# Patient Record
Sex: Female | Born: 1960 | Race: White | Hispanic: No | Marital: Married | Smoking: Former smoker
Health system: Southern US, Community
[De-identification: ages and names within clinical notes are randomized; demographics above are authoritative.]

## PROBLEM LIST (undated history)

## (undated) DIAGNOSIS — K5792 Diverticulitis of intestine, part unspecified, without perforation or abscess without bleeding: Secondary | ICD-10-CM

## (undated) DIAGNOSIS — M810 Age-related osteoporosis without current pathological fracture: Secondary | ICD-10-CM

## (undated) HISTORY — PX: TUBAL LIGATION: SHX77

## (undated) HISTORY — PX: OTHER SURGICAL HISTORY: SHX169

## (undated) HISTORY — PX: COLONOSCOPY: SHX174

## (undated) HISTORY — DX: Age-related osteoporosis without current pathological fracture: M81.0

## (undated) HISTORY — DX: Diverticulitis of intestine, part unspecified, without perforation or abscess without bleeding: K57.92

---

## 2000-08-04 ENCOUNTER — Ambulatory Visit (HOSPITAL_COMMUNITY): Admission: RE | Admit: 2000-08-04 | Discharge: 2000-08-04 | Payer: Self-pay | Admitting: Obstetrics and Gynecology

## 2000-08-04 ENCOUNTER — Encounter: Payer: Self-pay | Admitting: Obstetrics and Gynecology

## 2001-10-12 ENCOUNTER — Encounter: Payer: Self-pay | Admitting: Obstetrics and Gynecology

## 2001-10-12 ENCOUNTER — Ambulatory Visit (HOSPITAL_COMMUNITY): Admission: RE | Admit: 2001-10-12 | Discharge: 2001-10-12 | Payer: Self-pay | Admitting: Obstetrics and Gynecology

## 2001-11-07 ENCOUNTER — Encounter: Payer: Self-pay | Admitting: Obstetrics and Gynecology

## 2001-11-07 ENCOUNTER — Encounter: Admission: RE | Admit: 2001-11-07 | Discharge: 2001-11-07 | Payer: Self-pay | Admitting: Obstetrics and Gynecology

## 2003-01-18 ENCOUNTER — Ambulatory Visit (HOSPITAL_COMMUNITY): Admission: RE | Admit: 2003-01-18 | Discharge: 2003-01-18 | Payer: Self-pay | Admitting: Obstetrics and Gynecology

## 2004-09-09 ENCOUNTER — Ambulatory Visit (HOSPITAL_COMMUNITY): Admission: RE | Admit: 2004-09-09 | Discharge: 2004-09-09 | Payer: Self-pay | Admitting: Internal Medicine

## 2005-12-03 ENCOUNTER — Ambulatory Visit (HOSPITAL_COMMUNITY): Admission: RE | Admit: 2005-12-03 | Discharge: 2005-12-03 | Payer: Self-pay | Admitting: Obstetrics and Gynecology

## 2005-12-30 ENCOUNTER — Ambulatory Visit (HOSPITAL_COMMUNITY): Admission: RE | Admit: 2005-12-30 | Discharge: 2005-12-30 | Payer: Self-pay | Admitting: Obstetrics and Gynecology

## 2007-03-01 ENCOUNTER — Ambulatory Visit (HOSPITAL_COMMUNITY): Admission: RE | Admit: 2007-03-01 | Discharge: 2007-03-01 | Payer: Self-pay | Admitting: Obstetrics and Gynecology

## 2007-10-17 ENCOUNTER — Other Ambulatory Visit: Admission: RE | Admit: 2007-10-17 | Discharge: 2007-10-17 | Payer: Self-pay | Admitting: Obstetrics & Gynecology

## 2009-10-01 ENCOUNTER — Ambulatory Visit (HOSPITAL_COMMUNITY): Admission: RE | Admit: 2009-10-01 | Discharge: 2009-10-01 | Payer: Self-pay | Admitting: Obstetrics & Gynecology

## 2009-11-14 ENCOUNTER — Other Ambulatory Visit: Admission: RE | Admit: 2009-11-14 | Discharge: 2009-11-14 | Payer: Self-pay | Admitting: Obstetrics & Gynecology

## 2011-04-21 ENCOUNTER — Other Ambulatory Visit: Payer: Self-pay | Admitting: Obstetrics & Gynecology

## 2011-04-21 DIAGNOSIS — Z139 Encounter for screening, unspecified: Secondary | ICD-10-CM

## 2011-04-27 ENCOUNTER — Ambulatory Visit (HOSPITAL_COMMUNITY)
Admission: RE | Admit: 2011-04-27 | Discharge: 2011-04-27 | Disposition: A | Discharge status: Home / Self Care | Payer: 59 | Source: Ambulatory Visit | Attending: Obstetrics & Gynecology | Admitting: Obstetrics & Gynecology

## 2011-04-27 DIAGNOSIS — Z139 Encounter for screening, unspecified: Secondary | ICD-10-CM

## 2011-04-27 DIAGNOSIS — Z1231 Encounter for screening mammogram for malignant neoplasm of breast: Secondary | ICD-10-CM | POA: Insufficient documentation

## 2011-06-11 ENCOUNTER — Other Ambulatory Visit: Payer: Self-pay | Admitting: Obstetrics & Gynecology

## 2011-06-11 ENCOUNTER — Other Ambulatory Visit (HOSPITAL_COMMUNITY)
Admission: RE | Admit: 2011-06-11 | Discharge: 2011-06-11 | Disposition: A | Discharge status: Home / Self Care | Payer: 59 | Source: Ambulatory Visit | Attending: Obstetrics & Gynecology | Admitting: Obstetrics & Gynecology

## 2011-06-11 DIAGNOSIS — Z01419 Encounter for gynecological examination (general) (routine) without abnormal findings: Secondary | ICD-10-CM | POA: Insufficient documentation

## 2013-09-14 ENCOUNTER — Other Ambulatory Visit: Payer: Self-pay | Admitting: Obstetrics & Gynecology

## 2013-09-14 DIAGNOSIS — Z1231 Encounter for screening mammogram for malignant neoplasm of breast: Secondary | ICD-10-CM

## 2013-09-20 ENCOUNTER — Ambulatory Visit (HOSPITAL_COMMUNITY)
Admission: RE | Admit: 2013-09-20 | Discharge: 2013-09-20 | Disposition: A | Discharge status: Home / Self Care | Payer: 59 | Source: Ambulatory Visit | Attending: Obstetrics & Gynecology | Admitting: Obstetrics & Gynecology

## 2013-09-20 DIAGNOSIS — Z1231 Encounter for screening mammogram for malignant neoplasm of breast: Secondary | ICD-10-CM | POA: Diagnosis present

## 2013-09-26 ENCOUNTER — Other Ambulatory Visit: Payer: Self-pay | Admitting: Obstetrics & Gynecology

## 2013-10-10 ENCOUNTER — Other Ambulatory Visit: Payer: Self-pay | Admitting: Obstetrics & Gynecology

## 2013-10-17 ENCOUNTER — Other Ambulatory Visit (HOSPITAL_COMMUNITY)
Admission: RE | Admit: 2013-10-17 | Discharge: 2013-10-17 | Disposition: A | Discharge status: Home / Self Care | Payer: 59 | Source: Ambulatory Visit | Attending: Obstetrics & Gynecology | Admitting: Obstetrics & Gynecology

## 2013-10-17 ENCOUNTER — Ambulatory Visit (INDEPENDENT_AMBULATORY_CARE_PROVIDER_SITE_OTHER): Payer: 59 | Admitting: Obstetrics & Gynecology

## 2013-10-17 ENCOUNTER — Encounter: Payer: Self-pay | Admitting: Obstetrics & Gynecology

## 2013-10-17 VITALS — BP 110/80 | Ht 61.0 in | Wt 124.0 lb

## 2013-10-17 DIAGNOSIS — Z1151 Encounter for screening for human papillomavirus (HPV): Secondary | ICD-10-CM | POA: Insufficient documentation

## 2013-10-17 DIAGNOSIS — Z1212 Encounter for screening for malignant neoplasm of rectum: Secondary | ICD-10-CM

## 2013-10-17 DIAGNOSIS — Z124 Encounter for screening for malignant neoplasm of cervix: Secondary | ICD-10-CM | POA: Insufficient documentation

## 2013-10-17 DIAGNOSIS — Z01419 Encounter for gynecological examination (general) (routine) without abnormal findings: Secondary | ICD-10-CM

## 2013-10-17 NOTE — Progress Notes (Signed)
Patient ID: Brandy Craig, female   DOB: 01-Nov-1960, 53 y.o.   MRN: [id number] Subjective:     Brandy Craig is a 53 y.o. female here for a routine exam.  Patient's last menstrual period was 08/18/2013. No obstetric history on file. Birth Control Method:  Lap BTL Menstrual Calendar(currently): regular  Current complaints: none.   Current acute medical issues:  none   Recent Gynecologic History Patient's last menstrual period was 08/18/2013. Last Pap: 2013,  normal Last mammogram: 09/2013,  normal  History reviewed. No pertinent past medical history.  History reviewed. No pertinent past surgical history.  OB History   Grav Para Term Preterm Abortions TAB SAB Ect Mult Living                  History   Social History  . Marital Status: Married    Spouse Name: N/A    Number of Children: N/A  . Years of Education: N/A   Social History Main Topics  . Smoking status: Former Games developer  . Smokeless tobacco: None  . Alcohol Use: None  . Drug Use: None  . Sexual Activity: None   Other Topics Concern  . None   Social History Narrative  . None    Family History  Problem Relation Age of Onset  . Hypertension Mother      Review of Systems  Review of Systems  Constitutional: Negative for fever, chills, weight loss, malaise/fatigue and diaphoresis.  HENT: Negative for hearing loss, ear pain, nosebleeds, congestion, sore throat, neck pain, tinnitus and ear discharge.   Eyes: Negative for blurred vision, double vision, photophobia, pain, discharge and redness.  Respiratory: Negative for cough, hemoptysis, sputum production, shortness of breath, wheezing and stridor.   Cardiovascular: Negative for chest pain, palpitations, orthopnea, claudication, leg swelling and PND.  Gastrointestinal: negative for abdominal pain. Negative for heartburn, nausea, vomiting, diarrhea, constipation, blood in stool and melena.  Genitourinary: Negative for dysuria, urgency, frequency, hematuria and  flank pain.  Musculoskeletal: Negative for myalgias, back pain, joint pain and falls.  Skin: Negative for itching and rash.  Neurological: Negative for dizziness, tingling, tremors, sensory change, speech change, focal weakness, seizures, loss of consciousness, weakness and headaches.  Endo/Heme/Allergies: Negative for environmental allergies and polydipsia. Does not bruise/bleed easily.  Psychiatric/Behavioral: Negative for depression, suicidal ideas, hallucinations, memory loss and substance abuse. The patient is not nervous/anxious and does not have insomnia.        Objective:    Physical Exam  Vitals reviewed. Constitutional: She is oriented to person, place, and time. She appears well-developed and well-nourished.  HENT:  Head: Normocephalic and atraumatic.        Right Ear: External ear normal.  Left Ear: External ear normal.  Nose: Nose normal.  Mouth/Throat: Oropharynx is clear and moist.  Eyes: Conjunctivae and EOM are normal. Pupils are equal, round, and reactive to light. Right eye exhibits no discharge. Left eye exhibits no discharge. No scleral icterus.  Neck: Normal range of motion. Neck supple. No tracheal deviation present. No thyromegaly present.  Cardiovascular: Normal rate, regular rhythm, normal heart sounds and intact distal pulses.  Exam reveals no gallop and no friction rub.   No murmur heard. Respiratory: Effort normal and breath sounds normal. No respiratory distress. She has no wheezes. She has no rales. She exhibits no tenderness.  GI: Soft. Bowel sounds are normal. She exhibits no distension and no mass. There is no tenderness. There is no rebound and no guarding.  Genitourinary:  Breasts no masses skin changes or nipple changes bilaterally      Vulva is normal without lesions Vagina is pink moist without discharge Cervix normal in appearance and pap is done Uterus is normal size shape and contour Adnexa is negative with normal sized ovaries  Rectal     hemoccult negative, normal tone, no masses  Musculoskeletal: Normal range of motion. She exhibits no edema and no tenderness.  Neurological: She is alert and oriented to person, place, and time. She has normal reflexes. She displays normal reflexes. No cranial nerve deficit. She exhibits normal muscle tone. Coordination normal.  Skin: Skin is warm and dry. No rash noted. No erythema. No pallor.  Psychiatric: She has a normal mood and affect. Her behavior is normal. Judgment and thought content normal.       Assessment:    Healthy female exam.    Plan:    Follow up in: 1 year.

## 2013-10-19 LAB — CYTOLOGY - PAP

## 2014-06-12 ENCOUNTER — Ambulatory Visit (INDEPENDENT_AMBULATORY_CARE_PROVIDER_SITE_OTHER): Payer: 59 | Admitting: Obstetrics & Gynecology

## 2014-06-12 ENCOUNTER — Encounter: Payer: Self-pay | Admitting: Obstetrics & Gynecology

## 2014-06-12 DIAGNOSIS — M7711 Lateral epicondylitis, right elbow: Secondary | ICD-10-CM

## 2014-06-12 DIAGNOSIS — R300 Dysuria: Secondary | ICD-10-CM | POA: Diagnosis not present

## 2014-06-12 DIAGNOSIS — N390 Urinary tract infection, site not specified: Secondary | ICD-10-CM

## 2014-06-12 LAB — POCT URINALYSIS DIPSTICK
Blood, UA: NEGATIVE
GLUCOSE UA: NEGATIVE
KETONES UA: NEGATIVE
Leukocytes, UA: NEGATIVE
Nitrite, UA: NEGATIVE
Protein, UA: NEGATIVE

## 2014-06-12 MED ORDER — PIROXICAM 20 MG PO CAPS: 20.0000 mg | ORAL_CAPSULE | Freq: Every day | ORAL | Status: DC

## 2014-06-12 MED ORDER — SULFAMETHOXAZOLE-TRIMETHOPRIM 800-160 MG PO TABS: 1.0000 | ORAL_TABLET | Freq: Two times a day (BID) | ORAL | Status: DC

## 2014-06-12 NOTE — Progress Notes (Signed)
Patient ID: Brandy Craig, female   DOB: 1960-06-04, 54 y.o.   MRN: [id number] Chief Complaint  Patient presents with  . gyn visit    c/c burning/ frequency with urination.    Blood pressure 120/80, pulse 60, temperature 97.8 F (36.6 C), weight 125 lb (56.7 kg).  Has had infrequent UTIs in the past Having symptoms for several days ua negative on pyridium  Bactrim DS x 7 days  Feldene daily for 1 month for back and generalized body aches and pains  FU 1 month

## 2014-06-14 LAB — URINE CULTURE: Organism ID, Bacteria: NO GROWTH

## 2015-06-20 ENCOUNTER — Ambulatory Visit (INDEPENDENT_AMBULATORY_CARE_PROVIDER_SITE_OTHER): Payer: Commercial Managed Care - HMO | Admitting: Obstetrics & Gynecology

## 2015-06-20 ENCOUNTER — Encounter: Payer: Self-pay | Admitting: Obstetrics & Gynecology

## 2015-06-20 ENCOUNTER — Other Ambulatory Visit (HOSPITAL_COMMUNITY)
Admission: RE | Admit: 2015-06-20 | Discharge: 2015-06-20 | Disposition: A | Discharge status: Home / Self Care | Payer: Self-pay | Source: Ambulatory Visit | Attending: Obstetrics & Gynecology | Admitting: Obstetrics & Gynecology

## 2015-06-20 VITALS — BP 110/70 | HR 72 | Ht 59.0 in | Wt 128.0 lb

## 2015-06-20 DIAGNOSIS — Z1211 Encounter for screening for malignant neoplasm of colon: Secondary | ICD-10-CM

## 2015-06-20 DIAGNOSIS — R7989 Other specified abnormal findings of blood chemistry: Secondary | ICD-10-CM

## 2015-06-20 DIAGNOSIS — E7800 Pure hypercholesterolemia, unspecified: Secondary | ICD-10-CM

## 2015-06-20 DIAGNOSIS — Z01419 Encounter for gynecological examination (general) (routine) without abnormal findings: Secondary | ICD-10-CM | POA: Diagnosis not present

## 2015-06-20 DIAGNOSIS — Z1212 Encounter for screening for malignant neoplasm of rectum: Secondary | ICD-10-CM

## 2015-06-20 DIAGNOSIS — R739 Hyperglycemia, unspecified: Secondary | ICD-10-CM

## 2015-06-20 DIAGNOSIS — E038 Other specified hypothyroidism: Secondary | ICD-10-CM

## 2015-06-20 NOTE — Progress Notes (Signed)
Patient ID: Brandy Craig, female   DOB: January 18, 1961, 55 y.o.   MRN: [id number] Subjective:     Brandy Craig is a 55 y.o. female here for a routine exam.  No LMP recorded. No obstetric history on file. Birth Control Method:  Postmenopausal state Menstrual Calendar(currently): Amenorrheic  Current complaints: Lethargy  Current acute medical issues:  None   Recent Gynecologic History No LMP recorded. Last Pap: 2015,  normal Last mammogram: 2015,  normal  History reviewed. No pertinent past medical history.  History reviewed. No pertinent past surgical history.  OB History    No data available      Social History   Social History  . Marital Status: Married    Spouse Name: N/A  . Number of Children: N/A  . Years of Education: N/A   Social History Main Topics  . Smoking status: Former Games developer  . Smokeless tobacco: None  . Alcohol Use: None  . Drug Use: None  . Sexual Activity: Not Asked   Other Topics Concern  . None   Social History Narrative    Family History  Problem Relation Age of Onset  . Hypertension Mother      Current outpatient prescriptions:  .  Calcium Carbonate-Vitamin D (CALCIUM-VITAMIN D) 500-200 MG-UNIT tablet, Take 1 tablet by mouth daily., Disp: , Rfl:   Review of Systems  Review of Systems  Constitutional: Negative for fever, chills, weight loss, malaise/fatigue and diaphoresis.  HENT: Negative for hearing loss, ear pain, nosebleeds, congestion, sore throat, neck pain, tinnitus and ear discharge.   Eyes: Negative for blurred vision, double vision, photophobia, pain, discharge and redness.  Respiratory: Negative for cough, hemoptysis, sputum production, shortness of breath, wheezing and stridor.   Cardiovascular: Negative for chest pain, palpitations, orthopnea, claudication, leg swelling and PND.  Gastrointestinal: negative for abdominal pain. Negative for heartburn, nausea, vomiting, diarrhea, constipation, blood in stool and melena.   Genitourinary: Negative for dysuria, urgency, frequency, hematuria and flank pain.  Musculoskeletal: Negative for myalgias, back pain, joint pain and falls.  Skin: Negative for itching and rash.  Neurological: Negative for dizziness, tingling, tremors, sensory change, speech change, focal weakness, seizures, loss of consciousness, weakness and headaches.  Endo/Heme/Allergies: Negative for environmental allergies and polydipsia. Does not bruise/bleed easily.  Psychiatric/Behavioral: Negative for depression, suicidal ideas, hallucinations, memory loss and substance abuse. The patient is not nervous/anxious and does not have insomnia.        Objective:  Blood pressure 110/70, pulse 72, height  (1.499 m), weight 128 lb (58.06 kg).   Physical Exam  Vitals reviewed. Constitutional: She is oriented to person, place, and time. She appears well-developed and well-nourished.  HENT:  Head: Normocephalic and atraumatic.        Right Ear: External ear normal.  Left Ear: External ear normal.  Nose: Nose normal.  Mouth/Throat: Oropharynx is clear and moist.  Eyes: Conjunctivae and EOM are normal. Pupils are equal, round, and reactive to light. Right eye exhibits no discharge. Left eye exhibits no discharge. No scleral icterus.  Neck: Normal range of motion. Neck supple. No tracheal deviation present. No thyromegaly present.  Cardiovascular: Normal rate, regular rhythm, normal heart sounds and intact distal pulses.  Exam reveals no gallop and no friction rub.   No murmur heard. Respiratory: Effort normal and breath sounds normal. No respiratory distress. She has no wheezes. She has no rales. She exhibits no tenderness.  GI: Soft. Bowel sounds are normal. She exhibits no distension and no mass. There  is no tenderness. There is no rebound and no guarding.  Genitourinary:  Breasts no masses skin changes or nipple changes bilaterally      Vulva is normal without lesions Vagina is pink moist without  discharge Cervix normal in appearance and pap is done Uterus is normal size shape and contour Adnexa is negative with normal sized ovaries  {Rectal    hemoccult negative, normal tone, no masses  Musculoskeletal: Normal range of motion. She exhibits no edema and no tenderness.  Neurological: She is alert and oriented to person, place, and time. She has normal reflexes. She displays normal reflexes. No cranial nerve deficit. She exhibits normal muscle tone. Coordination normal.  Skin: Skin is warm and dry. No rash noted. No erythema. No pallor.  Psychiatric: She has a normal mood and affect. Her behavior is normal. Judgment and thought content normal.       Medications Ordered at today's visit: Meds ordered this encounter  Medications  . Calcium Carbonate-Vitamin D (CALCIUM-VITAMIN D) 500-200 MG-UNIT tablet    Sig: Take 1 tablet by mouth daily.    Other orders placed at today's visit: Orders Placed This Encounter  Procedures  . Vitamin D 1,25 dihydroxy  . Lipid Profile  . TSH  . HgB A1c      Assessment:    Healthy female exam.    Plan:    Mammogram ordered. Follow up in: 1 year.

## 2015-06-24 LAB — CYTOLOGY - PAP

## 2015-09-30 ENCOUNTER — Other Ambulatory Visit: Payer: Commercial Managed Care - HMO

## 2015-09-30 ENCOUNTER — Telehealth: Payer: Self-pay | Admitting: *Deleted

## 2015-09-30 ENCOUNTER — Other Ambulatory Visit: Payer: Self-pay | Admitting: Obstetrics & Gynecology

## 2015-09-30 DIAGNOSIS — R300 Dysuria: Secondary | ICD-10-CM

## 2015-09-30 MED ORDER — SULFAMETHOXAZOLE-TRIMETHOPRIM 800-160 MG PO TABS: 1.0000 | ORAL_TABLET | Freq: Two times a day (BID) | ORAL | 0 refills | Status: DC

## 2015-09-30 NOTE — Addendum Note (Signed)
Addended by: Criss AlvinePULLIAM, Taysia Rivere G on: 09/30/2015 03:54 PM   Modules accepted: Orders

## 2015-09-30 NOTE — Telephone Encounter (Signed)
Pt requesting abx for UTI, c/o burning with urination.

## 2015-09-30 NOTE — Telephone Encounter (Signed)
Pt informed Bactrim e-scribed to [facility], [place]. Pt verbalized understanding.

## 2015-10-02 LAB — URINE CULTURE

## 2016-10-22 ENCOUNTER — Other Ambulatory Visit: Payer: Self-pay | Admitting: Obstetrics & Gynecology

## 2016-10-22 DIAGNOSIS — Z1231 Encounter for screening mammogram for malignant neoplasm of breast: Secondary | ICD-10-CM

## 2016-10-26 ENCOUNTER — Encounter (HOSPITAL_COMMUNITY): Payer: Self-pay | Admitting: Radiology

## 2016-10-26 ENCOUNTER — Ambulatory Visit (HOSPITAL_COMMUNITY)
Admission: RE | Admit: 2016-10-26 | Discharge: 2016-10-26 | Disposition: A | Discharge status: Home / Self Care | Payer: 59 | Source: Ambulatory Visit | Attending: Obstetrics & Gynecology | Admitting: Obstetrics & Gynecology

## 2016-10-26 DIAGNOSIS — Z1231 Encounter for screening mammogram for malignant neoplasm of breast: Secondary | ICD-10-CM | POA: Diagnosis not present

## 2016-11-20 ENCOUNTER — Other Ambulatory Visit: Payer: Commercial Managed Care - HMO | Admitting: Obstetrics & Gynecology

## 2016-12-21 ENCOUNTER — Other Ambulatory Visit: Payer: Commercial Managed Care - HMO | Admitting: Obstetrics & Gynecology

## 2016-12-31 ENCOUNTER — Ambulatory Visit (INDEPENDENT_AMBULATORY_CARE_PROVIDER_SITE_OTHER): Payer: 59 | Admitting: Obstetrics & Gynecology

## 2016-12-31 ENCOUNTER — Other Ambulatory Visit: Payer: Self-pay

## 2016-12-31 ENCOUNTER — Encounter: Payer: Self-pay | Admitting: Obstetrics & Gynecology

## 2016-12-31 ENCOUNTER — Other Ambulatory Visit (HOSPITAL_COMMUNITY)
Admission: RE | Admit: 2016-12-31 | Discharge: 2016-12-31 | Disposition: A | Discharge status: Home / Self Care | Payer: 59 | Source: Ambulatory Visit | Attending: Obstetrics & Gynecology | Admitting: Obstetrics & Gynecology

## 2016-12-31 VITALS — BP 100/52 | HR 77 | Ht 59.0 in | Wt 128.0 lb

## 2016-12-31 DIAGNOSIS — Z01419 Encounter for gynecological examination (general) (routine) without abnormal findings: Secondary | ICD-10-CM

## 2016-12-31 DIAGNOSIS — N841 Polyp of cervix uteri: Secondary | ICD-10-CM | POA: Diagnosis not present

## 2016-12-31 DIAGNOSIS — Z1211 Encounter for screening for malignant neoplasm of colon: Secondary | ICD-10-CM

## 2016-12-31 DIAGNOSIS — Z01411 Encounter for gynecological examination (general) (routine) with abnormal findings: Secondary | ICD-10-CM

## 2016-12-31 LAB — HEMOCCULT GUIAC POC 1CARD (OFFICE): Fecal Occult Blood, POC: NEGATIVE

## 2016-12-31 NOTE — Progress Notes (Signed)
Subjective:     Brandy Craig is a 56 y.o. female here for a routine exam.  Patient's last menstrual period was 08/18/2013. [id number] Birth Control Method:  Tubal ligation Menstrual Calendar(currently): Amenorrheic  Current complaints: None.   Current acute medical issues:  None   Recent Gynecologic History Patient's last menstrual period was 08/18/2013. Last Pap: 2017,  normal Last mammogram: 2018,  normal  History reviewed. No pertinent past medical history.  Past Surgical History:  Procedure Laterality Date  . btl    . TUBAL LIGATION      OB History    Gravida Para Term Preterm AB Living   4 2 2   2 2    SAB TAB Ectopic Multiple Live Births   2              Social History   Socioeconomic History  . Marital status: Married    Spouse name: None  . Number of children: None  . Years of education: None  . Highest education level: None  Social Needs  . Financial resource strain: None  . Food insecurity - worry: None  . Food insecurity - inability: None  . Transportation needs - medical: None  . Transportation needs - non-medical: None  Occupational History  . None  Tobacco Use  . Smoking status: Former Games developermoker  . Smokeless tobacco: Never Used  Substance and Sexual Activity  . Alcohol use: No    Frequency: Never  . Drug use: No  . Sexual activity: Yes    Birth control/protection: Surgical  Other Topics Concern  . None  Social History Narrative  . None    Family History  Problem Relation Age of Onset  . Hypertension Mother   . Hypertension Paternal Grandfather      Current Outpatient Medications:  .  Calcium Carbonate-Vitamin D (CALCIUM-VITAMIN D) 500-200 MG-UNIT tablet, Take 1 tablet by mouth daily., Disp: , Rfl:   Review of Systems  Review of Systems  Constitutional: Negative for fever, chills, weight loss, malaise/fatigue and diaphoresis.  HENT: Negative for hearing loss, ear pain, nosebleeds, congestion, sore throat, neck pain, tinnitus and ear  discharge.   Eyes: Negative for blurred vision, double vision, photophobia, pain, discharge and redness.  Respiratory: Negative for cough, hemoptysis, sputum production, shortness of breath, wheezing and stridor.   Cardiovascular: Negative for chest pain, palpitations, orthopnea, claudication, leg swelling and PND.  Gastrointestinal: negative for abdominal pain. Negative for heartburn, nausea, vomiting, diarrhea, constipation, blood in stool and melena.  Genitourinary: Negative for dysuria, urgency, frequency, hematuria and flank pain.  Musculoskeletal: Negative for myalgias, back pain, joint pain and falls.  Skin: Negative for itching and rash.  Neurological: Negative for dizziness, tingling, tremors, sensory change, speech change, focal weakness, seizures, loss of consciousness, weakness and headaches.  Endo/Heme/Allergies: Negative for environmental allergies and polydipsia. Does not bruise/bleed easily.  Psychiatric/Behavioral: Negative for depression, suicidal ideas, hallucinations, memory loss and substance abuse. The patient is not nervous/anxious and does not have insomnia.        Objective:  Blood pressure (!) 100/52, pulse 77, height 4\' 11"  (1.499 m), weight 128 lb (58.1 kg), last menstrual period 08/18/2013.   Physical Exam  Vitals reviewed. Constitutional: She is oriented to person, place, and time. She appears well-developed and well-nourished.  HENT:  Head: Normocephalic and atraumatic.        Right Ear: External ear normal.  Left Ear: External ear normal.  Nose: Nose normal.  Mouth/Throat: Oropharynx is clear and moist.  Eyes:  Conjunctivae and EOM are normal. Pupils are equal, round, and reactive to light. Right eye exhibits no discharge. Left eye exhibits no discharge. No scleral icterus.  Neck: Normal range of motion. Neck supple. No tracheal deviation present. No thyromegaly present.  Cardiovascular: Normal rate, regular rhythm, normal heart sounds and intact distal  pulses.  Exam reveals no gallop and no friction rub.   No murmur heard. Respiratory: Effort normal and breath sounds normal. No respiratory distress. She has no wheezes. She has no rales. She exhibits no tenderness.  GI: Soft. Bowel sounds are normal. She exhibits no distension and no mass. There is no tenderness. There is no rebound and no guarding.  Genitourinary:  Breasts no masses skin changes or nipple changes bilaterally      Vulva is normal without lesions Vagina is pink moist without discharge Cervix normal in appearance and pap is done, small under cervical polyp is seen is removed without difficulty with cervical biopsy forceps  It is sent to pathology for evaluation , I have reassured the patient that it is very likely benign  Uterus is normal size shape and contour Adnexa is negative with normal sized ovaries  {Rectal    hemoccult negative, normal tone, no masses  Musculoskeletal: Normal range of motion. She exhibits no edema and no tenderness.  Neurological: She is alert and oriented to person, place, and time. She has normal reflexes. She displays normal reflexes. No cranial nerve deficit. She exhibits normal muscle tone. Coordination normal.  Skin: Skin is warm and dry. No rash noted. No erythema. No pallor.  Psychiatric: She has a normal mood and affect. Her behavior is normal. Judgment and thought content normal.       Medications Ordered at today's visit: No orders of the defined types were placed in this encounter.   Other orders placed at today's visit: Orders Placed This Encounter  Procedures  . POCT Occult Blood Stool  . PR BIOPSY CERVIX, 1 OR MORE, OR EXCISION OF LESION      Assessment:    Healthy female exam.   endocervical polyp Plan:    Mammogram ordered. Follow up in: 1 year.   Will call with results of endocervical polyp  Return in about 1 year (around 12/31/2017) for yearly, with Dr Despina HiddenEure.

## 2017-01-01 ENCOUNTER — Other Ambulatory Visit: Payer: Self-pay | Admitting: Obstetrics & Gynecology

## 2017-01-05 LAB — CYTOLOGY - PAP
Diagnosis: UNDETERMINED — AB
HPV (WINDOPATH): NOT DETECTED

## 2019-04-25 ENCOUNTER — Other Ambulatory Visit (HOSPITAL_COMMUNITY): Payer: Self-pay | Admitting: Obstetrics & Gynecology

## 2019-04-25 DIAGNOSIS — Z1231 Encounter for screening mammogram for malignant neoplasm of breast: Secondary | ICD-10-CM

## 2019-05-01 ENCOUNTER — Ambulatory Visit (HOSPITAL_COMMUNITY)
Admission: RE | Admit: 2019-05-01 | Discharge: 2019-05-01 | Disposition: A | Discharge status: Home / Self Care | Payer: 59 | Source: Ambulatory Visit | Attending: Obstetrics & Gynecology | Admitting: Obstetrics & Gynecology

## 2019-05-01 ENCOUNTER — Other Ambulatory Visit: Payer: Self-pay

## 2019-05-01 DIAGNOSIS — Z1231 Encounter for screening mammogram for malignant neoplasm of breast: Secondary | ICD-10-CM

## 2019-05-11 ENCOUNTER — Telehealth: Payer: Self-pay | Admitting: Obstetrics & Gynecology

## 2019-05-11 NOTE — Telephone Encounter (Signed)
Tried to reach the patient to remind her of her appointment/restrictions, no answer or answering machine. °

## 2019-05-15 ENCOUNTER — Ambulatory Visit (INDEPENDENT_AMBULATORY_CARE_PROVIDER_SITE_OTHER): Payer: 59 | Admitting: Obstetrics & Gynecology

## 2019-05-15 ENCOUNTER — Other Ambulatory Visit: Payer: Self-pay

## 2019-05-15 ENCOUNTER — Other Ambulatory Visit (HOSPITAL_COMMUNITY)
Admission: RE | Admit: 2019-05-15 | Discharge: 2019-05-15 | Disposition: A | Discharge status: Home / Self Care | Payer: 59 | Source: Ambulatory Visit | Attending: Obstetrics & Gynecology | Admitting: Obstetrics & Gynecology

## 2019-05-15 ENCOUNTER — Encounter: Payer: Self-pay | Admitting: Obstetrics & Gynecology

## 2019-05-15 VITALS — BP 117/76 | HR 66 | Ht 59.0 in | Wt 126.0 lb

## 2019-05-15 DIAGNOSIS — Z01419 Encounter for gynecological examination (general) (routine) without abnormal findings: Secondary | ICD-10-CM | POA: Insufficient documentation

## 2019-05-15 DIAGNOSIS — Z1211 Encounter for screening for malignant neoplasm of colon: Secondary | ICD-10-CM | POA: Diagnosis not present

## 2019-05-15 DIAGNOSIS — Z1212 Encounter for screening for malignant neoplasm of rectum: Secondary | ICD-10-CM | POA: Diagnosis not present

## 2019-05-15 NOTE — Progress Notes (Signed)
Subjective:     Brandy Craig is a 59 y.o. female here for a routine exam.  Patient's last menstrual period was 08/18/2013. [id number] Birth Control Method:  BTL Menstrual Calendar(currently): amenorrheic  Current complaints: none.   Current acute medical issues:  one   Recent Gynecologic History Patient's last menstrual period was 08/18/2013. Last Pap: 2018,  normal Last mammogram: 3/21,  normal  History reviewed. No pertinent past medical history.  Past Surgical History:  Procedure Laterality Date  . btl    . TUBAL LIGATION      OB History    Gravida  4   Para  2   Term  2   Preterm      AB  2   Living  2     SAB  2   TAB      Ectopic      Multiple      Live Births              Social History   Socioeconomic History  . Marital status: Married    Spouse name: Not on file  . Number of children: Not on file  . Years of education: Not on file  . Highest education level: Not on file  Occupational History  . Not on file  Tobacco Use  . Smoking status: Former Games developer  . Smokeless tobacco: Never Used  Substance and Sexual Activity  . Alcohol use: No  . Drug use: No  . Sexual activity: Yes    Birth control/protection: Surgical  Other Topics Concern  . Not on file  Social History Narrative  . Not on file   Social Determinants of Health   Financial Resource Strain:   . Difficulty of Paying Living Expenses:   Food Insecurity:   . Worried About Programme researcher, broadcasting/film/video in the Last Year:   . Barista in the Last Year:   Transportation Needs:   . Freight forwarder (Medical):   Marland Kitchen Lack of Transportation (Non-Medical):   Physical Activity:   . Days of Exercise per Week:   . Minutes of Exercise per Session:   Stress:   . Feeling of Stress :   Social Connections:   . Frequency of Communication with Friends and Family:   . Frequency of Social Gatherings with Friends and Family:   . Attends Religious Services:   . Active Member of Clubs or  Organizations:   . Attends Banker Meetings:   Marland Kitchen Marital Status:     Family History  Problem Relation Age of Onset  . Hypertension Mother   . Hypertension Paternal Grandfather      Current Outpatient Medications:  .  Calcium Carbonate-Vitamin D (CALCIUM-VITAMIN D) 500-200 MG-UNIT tablet, Take 1 tablet by mouth daily., Disp: , Rfl:   Review of Systems  Review of Systems  Constitutional: Negative for fever, chills, weight loss, malaise/fatigue and diaphoresis.  HENT: Negative for hearing loss, ear pain, nosebleeds, congestion, sore throat, neck pain, tinnitus and ear discharge.   Eyes: Negative for blurred vision, double vision, photophobia, pain, discharge and redness.  Respiratory: Negative for cough, hemoptysis, sputum production, shortness of breath, wheezing and stridor.   Cardiovascular: Negative for chest pain, palpitations, orthopnea, claudication, leg swelling and PND.  Gastrointestinal: negative for abdominal pain. Negative for heartburn, nausea, vomiting, diarrhea, constipation, blood in stool and melena.  Genitourinary: Negative for dysuria, urgency, frequency, hematuria and flank pain.  Musculoskeletal: Negative for myalgias, back pain, joint pain  and falls.  Skin: Negative for itching and rash.  Neurological: Negative for dizziness, tingling, tremors, sensory change, speech change, focal weakness, seizures, loss of consciousness, weakness and headaches.  Endo/Heme/Allergies: Negative for environmental allergies and polydipsia. Does not bruise/bleed easily.  Psychiatric/Behavioral: Negative for depression, suicidal ideas, hallucinations, memory loss and substance abuse. The patient is not nervous/anxious and does not have insomnia.        Objective:  Blood pressure 117/76, pulse 66, height 4\' 11"  (1.499 m), weight 126 lb (57.2 kg), last menstrual period 08/18/2013.   Physical Exam  Vitals reviewed. Constitutional: She is oriented to person, place, and  time. She appears well-developed and well-nourished.  HENT:  Head: Normocephalic and atraumatic.        Right Ear: External ear normal.  Left Ear: External ear normal.  Nose: Nose normal.  Mouth/Throat: Oropharynx is clear and moist.  Eyes: Conjunctivae and EOM are normal. Pupils are equal, round, and reactive to light. Right eye exhibits no discharge. Left eye exhibits no discharge. No scleral icterus.  Neck: Normal range of motion. Neck supple. No tracheal deviation present. No thyromegaly present.  Cardiovascular: Normal rate, regular rhythm, normal heart sounds and intact distal pulses.  Exam reveals no gallop and no friction rub.   No murmur heard. Respiratory: Effort normal and breath sounds normal. No respiratory distress. She has no wheezes. She has no rales. She exhibits no tenderness.  GI: Soft. Bowel sounds are normal. She exhibits no distension and no mass. There is no tenderness. There is no rebound and no guarding.  Genitourinary:  Breasts no masses skin changes or nipple changes bilaterally      Vulva is normal without lesions Vagina is pink moist without discharge Cervix normal in appearance and pap is done Uterus is normal size shape and contour Adnexa is negative with normal sized ovaries  {Rectal    hemoccult negative, normal tone, no masses  Musculoskeletal: Normal range of motion. She exhibits no edema and no tenderness.  Neurological: She is alert and oriented to person, place, and time. She has normal reflexes. She displays normal reflexes. No cranial nerve deficit. She exhibits normal muscle tone. Coordination normal.  Skin: Skin is warm and dry. No rash noted. No erythema. No pallor.  Psychiatric: She has a normal mood and affect. Her behavior is normal. Judgment and thought content normal.       Medications Ordered at today's visit: No orders of the defined types were placed in this encounter.   Other orders placed at today's visit: No orders of the defined  types were placed in this encounter.     Assessment:    Normal Gyn exam.    Plan:    Contraception: tubal ligation. Mammogram ordered. Follow up in: 3 years.     Return in about 3 years (around 05/15/2022) for with Dr Elonda Husky.

## 2019-05-17 LAB — CYTOLOGY - PAP
Comment: NEGATIVE
Diagnosis: NEGATIVE
Diagnosis: REACTIVE
High risk HPV: NEGATIVE

## 2020-06-03 ENCOUNTER — Other Ambulatory Visit (HOSPITAL_COMMUNITY): Payer: Self-pay | Admitting: Obstetrics & Gynecology

## 2020-06-03 DIAGNOSIS — Z1231 Encounter for screening mammogram for malignant neoplasm of breast: Secondary | ICD-10-CM

## 2020-06-06 ENCOUNTER — Other Ambulatory Visit: Payer: Self-pay

## 2020-06-06 ENCOUNTER — Ambulatory Visit (HOSPITAL_COMMUNITY)
Admission: RE | Admit: 2020-06-06 | Discharge: 2020-06-06 | Disposition: A | Discharge status: Home / Self Care | Payer: 59 | Source: Ambulatory Visit | Attending: Obstetrics & Gynecology | Admitting: Obstetrics & Gynecology

## 2020-06-06 DIAGNOSIS — Z1231 Encounter for screening mammogram for malignant neoplasm of breast: Secondary | ICD-10-CM | POA: Diagnosis present

## 2021-01-02 ENCOUNTER — Telehealth: Payer: Self-pay | Admitting: Obstetrics & Gynecology

## 2021-01-02 MED ORDER — FLUCONAZOLE 150 MG PO TABS: ORAL_TABLET | ORAL | 4 refills | Status: DC

## 2021-01-02 NOTE — Telephone Encounter (Signed)
Patient states she has a yeast infection and wants to see if you can call her in something for it.

## 2021-01-02 NOTE — Telephone Encounter (Signed)
Returned pt's call. Two identifiers used. Pt states she is having itching, swelling, odor, irritation, and white, clumpy discharge. She has taken Diflucan in the past and wanted to have that again, along with some refills, if possible.

## 2021-01-02 NOTE — Telephone Encounter (Signed)
Called pt to relay prescription info. Two identifiers used. Pt confirmed understanding.

## 2021-07-28 ENCOUNTER — Other Ambulatory Visit (HOSPITAL_COMMUNITY): Payer: Self-pay | Admitting: Obstetrics & Gynecology

## 2021-07-28 DIAGNOSIS — Z1231 Encounter for screening mammogram for malignant neoplasm of breast: Secondary | ICD-10-CM

## 2021-07-30 ENCOUNTER — Ambulatory Visit (HOSPITAL_COMMUNITY)
Admission: RE | Admit: 2021-07-30 | Discharge: 2021-07-30 | Disposition: A | Discharge status: Home / Self Care | Payer: 59 | Source: Ambulatory Visit | Attending: Obstetrics & Gynecology | Admitting: Obstetrics & Gynecology

## 2021-07-30 DIAGNOSIS — Z1231 Encounter for screening mammogram for malignant neoplasm of breast: Secondary | ICD-10-CM

## 2022-08-31 ENCOUNTER — Encounter (INDEPENDENT_AMBULATORY_CARE_PROVIDER_SITE_OTHER): Payer: Self-pay | Admitting: *Deleted

## 2022-09-03 ENCOUNTER — Ambulatory Visit: Payer: 59 | Admitting: Obstetrics & Gynecology

## 2022-09-24 ENCOUNTER — Ambulatory Visit (INDEPENDENT_AMBULATORY_CARE_PROVIDER_SITE_OTHER): Payer: 59 | Admitting: Gastroenterology

## 2022-09-28 ENCOUNTER — Ambulatory Visit (INDEPENDENT_AMBULATORY_CARE_PROVIDER_SITE_OTHER): Payer: 59 | Admitting: Gastroenterology

## 2022-09-28 ENCOUNTER — Encounter (INDEPENDENT_AMBULATORY_CARE_PROVIDER_SITE_OTHER): Payer: Self-pay | Admitting: Gastroenterology

## 2022-09-28 VITALS — BP 117/67 | HR 59 | Temp 98.2°F | Ht 60.0 in | Wt 128.0 lb

## 2022-09-28 DIAGNOSIS — R1319 Other dysphagia: Secondary | ICD-10-CM | POA: Diagnosis not present

## 2022-09-28 DIAGNOSIS — K5732 Diverticulitis of large intestine without perforation or abscess without bleeding: Secondary | ICD-10-CM

## 2022-09-28 DIAGNOSIS — K5904 Chronic idiopathic constipation: Secondary | ICD-10-CM | POA: Diagnosis not present

## 2022-09-28 MED ORDER — POLYETHYLENE GLYCOL 3350 17 G PO PACK: 17.0000 g | PACK | Freq: Two times a day (BID) | ORAL | 0 refills | Status: AC

## 2022-09-28 MED ORDER — PSYLLIUM 58.6 % PO PACK: 1.0000 | PACK | Freq: Two times a day (BID) | ORAL | 2 refills | Status: AC

## 2022-09-28 NOTE — Patient Instructions (Signed)
It was very nice to meet you today, as dicussed with will plan for the following :  1) Ct Abdomen and pelvis  2) Colonoscopy and endoscopy  3) Ensure adequate fluid intake: Aim for 8 glasses of water daily. Follow a high fiber diet: Include foods such as dates, prunes, pears, and kiwi. Take Miralax twice a day for the first week, then reduce to once daily thereafter. Use Metamucil twice a day.

## 2022-09-28 NOTE — Progress Notes (Signed)
Vista Lawman , M.D. Gastroenterology & Hepatology [facility] Gastroenterology [address] [place], [place] [address] Primary Care Physician: Wendall Papa [address] [place] [address]  Chief Complaint:   Dysphagia and colonoscopy after episode of diverticulitis  History of Present Illness: Brandy Craig is a 62 y.o. female with ?history of recurrent diverticulitis who presents for evaluation of Dysphagia and colonoscopy after episode of diverticulitis  Patient reports that throughout the years she had recurrent abdominal pain mostly reported right lower quadrant.  Last episode was a month ago.  She has sharp knifelike pain in right lower quadrant.  She went to the doctors outpatient.  It is unclear if patient was ever given antibiotics or had a CAT scan  She had last colonoscopy 2021 which was incomplete due to severe diverticulosis tortuous and redundant colon.  Patient reports that for past 1 year she has liquid and solid food dysphagia.  She would feel something slow down in the middle of her chest .  She would have episodes of regurgitation if she eats too full  Has occasional hard stools with straining for which she takes only for.  The patient denies having any  fever, chills, hematochezia, melena, hematemesis, abdominal distention, diarrhea, jaundice, pruritus or weight loss.  Last QMV:HQIO Last Colonoscopy:2021 [facility] Impression:            - Diverticulosis in the sigmoid colon.                         - Stricture in the sigmoid colon.                         - Perianal skin tags found on perianal exam.                         - No specimens collected.   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 Secondary to severe       diverticulosis, narrowing of colonic lumen. Hypertophy and stiffenening       of colon. Adult scope exchanged for pediatric scope without success.        Recomend air contrast barium enema    Extensive  sigmoid diverticulosis is noted. Mild diverticulosis of  descending colon is noted. Redundancy of sigmoid colon is noted as  well. Small amount of retained stool is noted in the transverse and  ascending colon. No definite evidence of large mass or stricture is  noted. Small masses or polyps may be obscured due to the previously  described diverticulosis. Air and contrast filling to the cecum is  noted. Filling of the appendix and distal small bowel is noted.    Repeat colonoscopy 5 years   FHx: neg for any gastrointestinal/liver disease, no malignancies Social: neg smoking, alcohol or illicit drug use Surgical: no abdominal surgeries  Past Medical History: Past Medical History:  Diagnosis Date   Diverticulitis    Osteoporosis     Past Surgical History: Past Surgical History:  Procedure Laterality Date   btl     COLONOSCOPY     Dr. Guadlupe Spanish in [place]. pt states due 2025   TUBAL LIGATION      Family History: Family History  Problem Relation Age of Onset   Hypertension Mother    Hypertension Paternal Grandfather     Social History: Social History   Tobacco Use  Smoking Status Former  Passive exposure: Past  Smokeless Tobacco Never   Social History   Substance and Sexual Activity  Alcohol Use No   Social History   Substance and Sexual Activity  Drug Use No    Allergies: No Known Allergies  Medications: Current Outpatient Medications  Medication Sig Dispense Refill   OVER THE COUNTER MEDICATION Calcium with vit d 1000 daily  Multi vit daily   Vit b12 daily   Fish oil every other day   Gelatin pills about 3 times per week   Lysine 2,000 mg in the morning.     polyethylene glycol (MIRALAX / GLYCOLAX) 17 g packet Take 17 g by mouth 2 (two) times daily. 180 packet 0   psyllium (METAMUCIL) 58.6 % packet Take 1 packet by mouth 2 (two) times daily. 60 packet 2   No current facility-administered medications for this visit.    Review of  Systems: GENERAL: negative for malaise, night sweats HEENT: No changes in hearing or vision, no nose bleeds or other nasal problems. NECK: Negative for lumps, goiter, pain and significant neck swelling RESPIRATORY: Negative for cough, wheezing CARDIOVASCULAR: Negative for chest pain, leg swelling, palpitations, orthopnea GI: SEE HPI MUSCULOSKELETAL: Negative for joint pain or swelling, back pain, and muscle pain. SKIN: Negative for lesions, rash HEMATOLOGY Negative for prolonged bleeding, bruising easily, and swollen nodes. ENDOCRINE: Negative for cold or heat intolerance, polyuria, polydipsia and goiter. NEURO: negative for tremor, gait imbalance, syncope and seizures. The remainder of the review of systems is noncontributory.   Physical Exam: BP 117/67 (BP Location: Left Arm, Patient Position: Sitting, Cuff Size: Normal)   Pulse (!) 59   Temp 98.2 F (36.8 C) (Oral)   Ht 5' (1.524 m)   Wt 128 lb (58.1 kg)   LMP 08/18/2013   BMI 25.00 kg/m  GENERAL: The patient is AO x3, in no acute distress. HEENT: Head is normocephalic and atraumatic. EOMI are intact. Mouth is well hydrated and without lesions. NECK: Supple. No masses LUNGS: Clear to auscultation. No presence of rhonchi/wheezing/rales. Adequate chest expansion HEART: RRR, normal s1 and s2. ABDOMEN: Soft, nontender, no guarding, no peritoneal signs, and nondistended. BS +. No masses. EXTREMITIES: Without any cyanosis, clubbing, rash, lesions or edema. NEUROLOGIC: AOx3, no focal motor deficit. SKIN: no jaundice, no rashes  Imaging/Labs: as above  I personally reviewed and interpreted the available labs, imaging and endoscopic files.  Impression and Plan:  Brandy Craig is a 62 y.o. female with ?history of recurrent diverticulitis who presents for evaluation of Dysphagia and colonoscopy after episode of diverticulitis  # Dysphagia  Patient had recurrent episodes of solid and liquid food dysphagia with regurgitation.   Denies any heartburn.  This could be due to EOE or underlying motility disorder.  Need to rule out distal esophageal rings of a stricture or webs  Will proceed with upper endoscopy with esophageal biopsies plus or minus dilation  If EGD is normal the patient continues to have symptoms may need to be referred for manometry  # History of diverticulitis #Abdominal pain   Patient reports 1 month ago she had right lower quadrant severe abdominal pain where she was diagnosed with diverticulitis on an outpatient setting.  No imaging was done and unsure if patient got antibiotics  He reports multiple episodes of diverticulitis previously treated conservatively  It does not appear the patient had a complete colonoscopy however as last colonoscopy 2021 was difficult and was aborted due to tortuous colon  Will obtain CT abdomen pelvis to  evaluate for any complication of recurrent diverticulitis  Also patient agrees to proceed with diagnostic colonoscopy after 1 month as a follow-up for diverticulitis and no previous complete colonoscopy.  Will start with pediatric colonoscope and may need to be switched to ultraslim colonoscope  # Constipation Patient has occasional hard stools with straining  .  Given diverticulosis with diverticulitis it is imperative that patient constipation is under control  Recs:  Ensure adequate fluid intake: Aim for 8 glasses of water daily. Follow a high fiber diet: Include foods such as dates, prunes, pears, and kiwi. Take Miralax twice a day for the first week, then reduce to once daily thereafter. Use Metamucil twice a day.   All questions were answered.      Vista Lawman, MD Gastroenterology and Hepatology [facility] Gastroenterology   This chart has been completed using [facility] Dictation software, and while attempts have been made to ensure accuracy , certain words and phrases may not be transcribed as intended

## 2022-09-29 ENCOUNTER — Ambulatory Visit: Payer: 59 | Admitting: Obstetrics & Gynecology

## 2022-09-29 ENCOUNTER — Encounter: Payer: Self-pay | Admitting: Obstetrics & Gynecology

## 2022-09-29 ENCOUNTER — Other Ambulatory Visit (HOSPITAL_COMMUNITY)
Admission: RE | Admit: 2022-09-29 | Discharge: 2022-09-29 | Disposition: A | Discharge status: Home / Self Care | Payer: 59 | Source: Ambulatory Visit | Attending: Obstetrics & Gynecology | Admitting: Obstetrics & Gynecology

## 2022-09-29 ENCOUNTER — Encounter (INDEPENDENT_AMBULATORY_CARE_PROVIDER_SITE_OTHER): Payer: Self-pay

## 2022-09-29 VITALS — BP 125/67 | HR 54 | Ht 59.0 in | Wt 128.6 lb

## 2022-09-29 DIAGNOSIS — N952 Postmenopausal atrophic vaginitis: Secondary | ICD-10-CM

## 2022-09-29 DIAGNOSIS — N941 Unspecified dyspareunia: Secondary | ICD-10-CM | POA: Diagnosis not present

## 2022-09-29 DIAGNOSIS — Z124 Encounter for screening for malignant neoplasm of cervix: Secondary | ICD-10-CM | POA: Diagnosis present

## 2022-09-29 DIAGNOSIS — N8110 Cystocele, unspecified: Secondary | ICD-10-CM

## 2022-09-29 MED ORDER — PEG 3350-KCL-NA BICARB-NACL 420 G PO SOLR: 4000.0000 mL | Freq: Once | ORAL | 0 refills | Status: AC

## 2022-09-29 MED ORDER — PREMARIN 0.625 MG/GM VA CREA: TOPICAL_CREAM | VAGINAL | 12 refills | Status: DC

## 2022-09-29 NOTE — Addendum Note (Signed)
Addended by: Marlowe Shores on: 09/29/2022 02:27 PM   Modules accepted: Orders

## 2022-09-29 NOTE — Progress Notes (Signed)
GYN VISIT Patient name: Brandy Craig MRN [id number]  Date of birth: 1960-08-16 Chief Complaint:   Pelvic Pain  History of Present Illness:   Brandy Craig is a 62 y.o. [phone] PM female being seen today for the following concerns:  Pelvic pain: Pain is mostly on the side-patient points to upper to mid right side.    Dyspareunia: Feels like she has a knot inside when she is having sex, happens about 75%.  Pain with insertion and "feels like it's not the same."  This has been going on for the past 1-2 mos. denies vaginal bleeding, vaginal discharge, itching or irritation.  Denies pelvic pain during intercourse.  She is using lubricants regularly  Up once to pee at night.  +Urgency.  Rate urge incontinence.  Denies stress incontinence.  Denies urinary frequency  Patient's last menstrual period was 08/18/2013.  Patient seen by PCP due to pelvic pain recent ultrasound completed in July records reviewed.  No acute abnormalities noted [place] 08/2022: Measurements: 6.4 x 2.8 x 4.5 cm = volume: 42 mL. No fibroids or other mass visualized. Ovaries not seen    Review of Systems:   Pertinent items are noted in HPI Denies fever/chills, dizziness, headaches, visual disturbances, fatigue, shortness of breath, chest pain, vomiting. Pertinent History Reviewed:   Past Surgical History:  Procedure Laterality Date   btl     COLONOSCOPY     Dr. Guadlupe Spanish in [place]. pt states due 2025   TUBAL LIGATION      Past Medical History:  Diagnosis Date   Diverticulitis    Osteoporosis    Reviewed problem list, medications and allergies. Physical Assessment:   Vitals:   09/29/22 0916  BP: 125/67  Pulse: (!) 54  Weight: 128 lb 9.6 oz (58.3 kg)  Height: 4\' 11"  (1.499 m)  Body mass index is 25.97 kg/m.       Physical Examination:   General appearance: alert, well appearing, and in no distress  Psych: mood appropriate, normal affect  Skin: warm & dry   Cardiovascular: normal heart rate  noted  Respiratory: normal respiratory effort, no distress  Abdomen: soft, non-tender, no rebound, no guarding  Back: no CVA tenderness  Pelvic: VULVA: normal appearing vulva with no masses, tenderness or lesions, VAGINA: Pink flat mucosa with loss of rugae, no lesions, CERVIX: normal appearing cervix without discharge or lesions-appears atrophic, UTERUS: uterus is normal size, shape, consistency and nontender, ADNEXA: normal adnexa in size, nontender and no masses.  Stage II cystocele noted with urethral hypermobility.  No rectocele no uterine prolapse  Extremities: no edema, no calf tenderness bilaterally  Chaperone:  Dr. Wyn Forster     Assessment & Plan:  1) preventive screening -Pap collected, reviewed ASCCP guidelines  2) dyspareunia, vaginal atrophy, stage II cystocele -Reassured patient that there was no evidence of cancer -Discussed findings on exam, reviewed conservative management -Plan to start on vaginal estrogen Pt denies history of any of the following:  Untreated hypertension, active liver disease with abnormal liver function tests, active or recent arterial thromboembolic disease, or previous or current venous thromboembolism  -Follow-up in 3 months []  Briefly discussed pessary and will review at next visit  Meds ordered this encounter  Medications   conjugated estrogens (PREMARIN) vaginal cream    Sig: Pea-sized (0.5g) amount in the vagina twice per week    Dispense:  42.5 g    Refill:  12      Return in about 3 months (around 12/30/2022) for  Medication follow up.   Myna Hidalgo, DO Attending Obstetrician & Gynecologist, [facility] for [employer], [employer] [facility]

## 2022-10-06 ENCOUNTER — Ambulatory Visit (HOSPITAL_COMMUNITY): Payer: 59

## 2022-10-13 ENCOUNTER — Ambulatory Visit (HOSPITAL_COMMUNITY)
Admission: RE | Admit: 2022-10-13 | Discharge: 2022-10-13 | Disposition: A | Discharge status: Home / Self Care | Payer: 59 | Source: Ambulatory Visit | Attending: Gastroenterology | Admitting: Gastroenterology

## 2022-10-13 DIAGNOSIS — K5732 Diverticulitis of large intestine without perforation or abscess without bleeding: Secondary | ICD-10-CM | POA: Insufficient documentation

## 2022-10-13 MED ORDER — IOHEXOL 300 MG/ML  SOLN
100.0000 mL | Freq: Once | INTRAMUSCULAR | Status: AC | PRN
  Administered 2022-10-13: 100 mL via INTRAVENOUS

## 2022-10-13 MED ORDER — IOHEXOL 9 MG/ML PO SOLN
1000.0000 mL | Freq: Once | ORAL | Status: AC
  Administered 2022-10-13: 1000 mL via ORAL

## 2022-10-13 MED ORDER — SODIUM CHLORIDE (PF) 0.9 % IJ SOLN
INTRAMUSCULAR | Status: AC
  Filled 2022-10-13: qty 50

## 2022-11-05 ENCOUNTER — Other Ambulatory Visit (HOSPITAL_COMMUNITY): Payer: 59

## 2022-11-09 ENCOUNTER — Ambulatory Visit (HOSPITAL_COMMUNITY): Payer: 59 | Admitting: Certified Registered"

## 2022-11-09 ENCOUNTER — Ambulatory Visit (HOSPITAL_COMMUNITY)
Admission: RE | Admit: 2022-11-09 | Discharge: 2022-11-09 | Disposition: A | Discharge status: Home / Self Care | Payer: 59 | Attending: Gastroenterology | Admitting: Gastroenterology

## 2022-11-09 ENCOUNTER — Encounter (HOSPITAL_COMMUNITY): Payer: Self-pay

## 2022-11-09 ENCOUNTER — Other Ambulatory Visit: Payer: Self-pay

## 2022-11-09 ENCOUNTER — Encounter (HOSPITAL_COMMUNITY)
Admission: RE | Disposition: A | Discharge status: Home / Self Care | Payer: Self-pay | Source: Home / Self Care | Attending: Gastroenterology

## 2022-11-09 DIAGNOSIS — K644 Residual hemorrhoidal skin tags: Secondary | ICD-10-CM | POA: Insufficient documentation

## 2022-11-09 DIAGNOSIS — Z87891 Personal history of nicotine dependence: Secondary | ICD-10-CM | POA: Diagnosis not present

## 2022-11-09 DIAGNOSIS — K648 Other hemorrhoids: Secondary | ICD-10-CM | POA: Insufficient documentation

## 2022-11-09 DIAGNOSIS — K31A0 Gastric intestinal metaplasia, unspecified: Secondary | ICD-10-CM

## 2022-11-09 DIAGNOSIS — K2970 Gastritis, unspecified, without bleeding: Secondary | ICD-10-CM | POA: Diagnosis not present

## 2022-11-09 DIAGNOSIS — K2270 Barrett's esophagus without dysplasia: Secondary | ICD-10-CM | POA: Insufficient documentation

## 2022-11-09 DIAGNOSIS — K5730 Diverticulosis of large intestine without perforation or abscess without bleeding: Secondary | ICD-10-CM | POA: Insufficient documentation

## 2022-11-09 DIAGNOSIS — D122 Benign neoplasm of ascending colon: Secondary | ICD-10-CM

## 2022-11-09 DIAGNOSIS — K449 Diaphragmatic hernia without obstruction or gangrene: Secondary | ICD-10-CM

## 2022-11-09 DIAGNOSIS — R1310 Dysphagia, unspecified: Secondary | ICD-10-CM | POA: Diagnosis present

## 2022-11-09 DIAGNOSIS — K2100 Gastro-esophageal reflux disease with esophagitis, without bleeding: Secondary | ICD-10-CM | POA: Insufficient documentation

## 2022-11-09 DIAGNOSIS — Z1211 Encounter for screening for malignant neoplasm of colon: Secondary | ICD-10-CM

## 2022-11-09 HISTORY — PX: BIOPSY: SHX5522

## 2022-11-09 HISTORY — PX: SCLEROTHERAPY: SHX6841

## 2022-11-09 HISTORY — PX: POLYPECTOMY: SHX5525

## 2022-11-09 HISTORY — PX: COLONOSCOPY WITH PROPOFOL: SHX5780

## 2022-11-09 HISTORY — PX: HEMOSTASIS CLIP PLACEMENT: SHX6857

## 2022-11-09 HISTORY — PX: ENDOSCOPIC MUCOSAL RESECTION: SHX6839

## 2022-11-09 HISTORY — PX: SUBMUCOSAL LIFTING INJECTION: SHX6855

## 2022-11-09 HISTORY — PX: ESOPHAGOGASTRODUODENOSCOPY (EGD) WITH PROPOFOL: SHX5813

## 2022-11-09 SURGERY — COLONOSCOPY WITH PROPOFOL
Anesthesia: General

## 2022-11-09 MED ORDER — PROPOFOL 500 MG/50ML IV EMUL
INTRAVENOUS | Status: DC | PRN
  Administered 2022-11-09: 150 ug/kg/min via INTRAVENOUS

## 2022-11-09 MED ORDER — SPOT INK MARKER SYRINGE KIT
PACK | SUBMUCOSAL | Status: AC
  Filled 2022-11-09: qty 5

## 2022-11-09 MED ORDER — PHENYLEPHRINE 80 MCG/ML (10ML) SYRINGE FOR IV PUSH (FOR BLOOD PRESSURE SUPPORT)
PREFILLED_SYRINGE | INTRAVENOUS | Status: DC | PRN
  Administered 2022-11-09: 80 ug via INTRAVENOUS
  Administered 2022-11-09: 160 ug via INTRAVENOUS

## 2022-11-09 MED ORDER — PROPOFOL 1000 MG/100ML IV EMUL
INTRAVENOUS | Status: AC
  Filled 2022-11-09: qty 100

## 2022-11-09 MED ORDER — SPOT INK MARKER SYRINGE KIT
PACK | SUBMUCOSAL | Status: DC | PRN
  Administered 2022-11-09: 5 mL via SUBMUCOSAL

## 2022-11-09 MED ORDER — LIDOCAINE HCL (CARDIAC) PF 100 MG/5ML IV SOSY
PREFILLED_SYRINGE | INTRAVENOUS | Status: DC | PRN
  Administered 2022-11-09: 100 mg via INTRAVENOUS

## 2022-11-09 MED ORDER — LACTATED RINGERS IV SOLN
INTRAVENOUS | Status: DC | PRN
Start: 2022-11-09 — End: 2022-11-09

## 2022-11-09 MED ORDER — SODIUM CHLORIDE FLUSH 0.9 % IV SOLN
INTRAVENOUS | Status: AC
  Filled 2022-11-09: qty 10

## 2022-11-09 MED ORDER — PROPOFOL 10 MG/ML IV BOLUS
INTRAVENOUS | Status: DC | PRN
Start: 2022-11-09 — End: 2022-11-09
  Administered 2022-11-09: 140 mg via INTRAVENOUS

## 2022-11-09 MED ORDER — LACTATED RINGERS IV SOLN: INTRAVENOUS | Status: DC

## 2022-11-09 NOTE — Op Note (Signed)
[facility] Patient Name: Brandy Craig Procedure Date: 11/09/2022 7:15 AM MRN: [id number] Date of Birth: [birth year] Attending MD: Sanjuan Dame , MD, [id number] CSN: [id number] Age: 62 Admit Type: Outpatient Procedure:                Colonoscopy Indications:              Screening for colorectal malignant neoplasm Providers:                Sanjuan Dame, MD, Edrick Kins, RN, Elinor Parkinson Referring MD:             Sanjuan Dame, MD Medicines:                Monitored Anesthesia Care Complications:            No immediate complications. Estimated Blood Loss:     Estimated blood loss was minimal. Procedure:                Pre-Anesthesia Assessment:                           - Prior to the procedure, a History and Physical                            was performed, and patient medications and                            allergies were reviewed. The patient's tolerance of                            previous anesthesia was also reviewed. The risks                            and benefits of the procedure and the sedation                            options and risks were discussed with the patient.                            All questions were answered, and informed consent                            was obtained. Prior Anticoagulants: The patient has                            taken no anticoagulant or antiplatelet agents. ASA                            Grade Assessment: II - A patient with mild systemic                            disease. After reviewing the risks and benefits,  the patient was deemed in satisfactory condition to                            undergo the procedure.                           After obtaining informed consent, the colonoscope                            was passed under direct vision. Throughout the                            procedure, the patient's blood pressure, pulse, and                             oxygen saturations were monitored continuously. The                            PCF-HQ190L ([id number]) scope was introduced through                            the anus and advanced to the the cecum, identified                            by appendiceal orifice and ileocecal valve. The                            colonoscopy was technically difficult and complex                            due to multiple diverticula in the colon,                            restricted mobility of the colon, a redundant colon                            and a tortuous colon. Successful completion of the                            procedure was aided by withdrawing and reinserting                            the scope, withdrawing the scope and replacing with                            the pediatric endoscope, straightening and                            shortening the scope to obtain bowel loop                            reduction, using scope torsion and applying  abdominal pressure. The patient tolerated the                            procedure well. Scope In: 7:51:46 AM Scope Out: 8:52:53 AM Scope Withdrawal Time: 0 hours 33 minutes 36 seconds  Total Procedure Duration: 1 hour 1 minute 7 seconds  Findings:      The left colon was significantly tortuous. Advancing the scope required       using manual pressure. Advancing the scope required withdrawing the       scope and replacing with the pediatric endoscope.      A 35 mm polyp was found in the ascending colon. The polyp was       carpet-like. Preparations were made for mucosal resection. Demarcation       of the lesion was performed with high-definition white light and narrow       band imaging to clearly identify the boundaries of the lesion. Eleview       was injected to raise the lesion. Piecemeal mucosal resection using a       snare was performed. Resection and retrieval were complete. Resected       tissue margins were  examined and clear of polyp tissue. To close a       defect after polypectomy, one hemostatic clip was successfully placed       (MR conditional). Clip manufacturer: [employer]. There was no       bleeding at the end of the procedure. Area was tattooed with an       injection of 5 mL of Spot (carbon black).      Scattered large-mouthed diverticula were found in the entire colon.      Non-bleeding external and internal hemorrhoids were found during       retroflexion. Impression:               - Tortuous colon.                           - One 35 mm polyp in the ascending colon, removed                            with mucosal resection. Resected and retrieved.                            Clip (MR conditional) was placed. Clip                            manufacturer: [employer]. Tattooed.                           - Diverticulosis in the entire examined colon.                           - Non-bleeding external and internal hemorrhoids.                           - Mucosal resection was performed. Resection and                            retrieval were complete. Moderate  Sedation:      Per Anesthesia Care Recommendation:           - Patient has a contact number available for                            emergencies. The signs and symptoms of potential                            delayed complications were discussed with the                            patient. Return to normal activities tomorrow.                            Written discharge instructions were provided to the                            patient.                           - Resume previous diet.                           - Continue present medications.                           - Await pathology results.                           - Repeat colonoscopy in 9 months for surveillance                            after piecemeal polypectomy.                           - Return to primary care physician as previously                             scheduled. Procedure Code(s):        --- Professional ---                           [phone], Colonoscopy, flexible; with endoscopic                            mucosal resection Diagnosis Code(s):        --- Professional ---                           Z12.11, Encounter for screening for malignant                            neoplasm of colon                           D12.2, Benign neoplasm of ascending colon  K64.8, Other hemorrhoids                           K57.30, Diverticulosis of large intestine without                            perforation or abscess without bleeding                           Q43.8, Other specified congenital malformations of                            intestine CPT copyright 2022 American Medical Association. All rights reserved. The codes documented in this report are preliminary and upon coder review may  be revised to meet current compliance requirements. Sanjuan Dame, MD Sanjuan Dame, MD 11/09/2022 9:06:39 AM This report has been signed electronically. Number of Addenda: 0

## 2022-11-09 NOTE — Transfer of Care (Addendum)
//  Immediate Anesthesia Transfer of Care Note  Patient: Brandy Craig  Procedure(s) Performed: COLONOSCOPY WITH PROPOFOL ESOPHAGOGASTRODUODENOSCOPY (EGD) WITH PROPOFOL BIOPSY SCLEROTHERAPY SUBMUCOSAL LIFTING INJECTION POLYPECTOMY HEMOSTASIS CLIP PLACEMENT ENDOSCOPIC MUCOSAL RESECTION  Patient Location: Short Stay  Anesthesia Type:General  Level of Consciousness: drowsy and patient cooperative  Airway & Oxygen Therapy: Patient Spontanous Breathing and Patient connected to nasal cannula oxygen  Post-op Assessment: Report given to RN and Post -op Vital signs reviewed and stable  Post vital signs: Reviewed and stable  Last Vitals:  Vitals Value Taken Time  BP 133/53 11/09/22   0856  Temp 36.4 11/09/22   0856  Pulse 58 11/09/22   0856  Resp 20 11/09/22   0856  SpO2 100% 11/09/22   0856    Last Pain:  Vitals:   11/09/22 0733  TempSrc:   PainSc: 0-No pain      Patients Stated Pain Goal: 5 (11/09/22 0651)  Complications: No notable events documented.   /

## 2022-11-09 NOTE — Anesthesia Preprocedure Evaluation (Signed)
Anesthesia Evaluation  Patient identified by MRN, date of birth, ID band Patient awake    Reviewed: Allergy & Precautions, H&P , NPO status , Patient's Chart, lab work & pertinent test results, reviewed documented beta blocker date and time   Airway Mallampati: II  TM Distance: >3 FB Neck ROM: full    Dental no notable dental hx.    Pulmonary neg pulmonary ROS, former smoker   Pulmonary exam normal breath sounds clear to auscultation       Cardiovascular Exercise Tolerance: Good negative cardio ROS  Rhythm:regular Rate:Normal     Neuro/Psych negative neurological ROS  negative psych ROS   GI/Hepatic negative GI ROS, Neg liver ROS,,,  Endo/Other  negative endocrine ROS    Renal/GU negative Renal ROS  negative genitourinary   Musculoskeletal   Abdominal   Peds  Hematology negative hematology ROS (+)   Anesthesia Other Findings   Reproductive/Obstetrics negative OB ROS                             Anesthesia Physical Anesthesia Plan  ASA: 2  Anesthesia Plan: General   Post-op Pain Management:    Induction:   PONV Risk Score and Plan: Propofol infusion  Airway Management Planned:   Additional Equipment:   Intra-op Plan:   Post-operative Plan:   Informed Consent: I have reviewed the patients History and Physical, chart, labs and discussed the procedure including the risks, benefits and alternatives for the proposed anesthesia with the patient or authorized representative who has indicated his/her understanding and acceptance.     Dental Advisory Given  Plan Discussed with: CRNA  Anesthesia Plan Comments:        Anesthesia Quick Evaluation

## 2022-11-09 NOTE — Op Note (Signed)
[facility] Patient Name: Brandy Craig Procedure Date: 11/09/2022 7:17 AM MRN: [id number] Date of Birth: 1960/07/29 Attending MD: Sanjuan Dame , MD, [id number] CSN: [id number] Age: 62 Admit Type: Outpatient Procedure:                Upper GI endoscopy Indications:              Epigastric abdominal pain, Dysphagia Providers:                Sanjuan Dame, MD, Edrick Kins, RN, Elinor Parkinson Referring MD:             Sanjuan Dame, MD Medicines:                Propofol per Anesthesia Complications:            No immediate complications. Estimated Blood Loss:     Estimated blood loss: none. Procedure:                Pre-Anesthesia Assessment:                           - Prior to the procedure, a History and Physical                            was performed, and patient medications and                            allergies were reviewed. The patient's tolerance of                            previous anesthesia was also reviewed. The risks                            and benefits of the procedure and the sedation                            options and risks were discussed with the patient.                            All questions were answered, and informed consent                            was obtained. Prior Anticoagulants: The patient has                            taken no anticoagulant or antiplatelet agents. ASA                            Grade Assessment: II - A patient with mild systemic                            disease. After reviewing the risks and benefits,  the patient was deemed in satisfactory condition to                            undergo the procedure.                           After obtaining informed consent, the endoscope was                            passed under direct vision. Throughout the                            procedure, the patient's blood pressure, pulse, and                            oxygen  saturations were monitored continuously. The                            [id number]-H190 ([id number]) scope was introduced through the                            mouth, and advanced to the second part of duodenum.                            The upper GI endoscopy was accomplished without                            difficulty. The patient tolerated the procedure                            well. Scope In: 7:38:37 AM Scope Out: 7:45:24 AM Total Procedure Duration: 0 hours 6 minutes 47 seconds  Findings:      LA Grade B (one or more mucosal breaks greater than 5 mm, not extending       between the tops of two mucosal folds) esophagitis with no bleeding was       found in the distal esophagus.      Esophagogastric landmarks were identified: the Z-line was found at 33 cm       and the gastroesophageal junction was found at 37 cm from the incisors.      There is no endoscopic evidence of stricture in the entire esophagus.       Biopsies were obtained from the proximal and distal esophagus with cold       forceps for histology of suspected eosinophilic esophagitis.      A 4 cm hiatal hernia was present.      Mild inflammation characterized by nodularity was found in the entire       examined stomach. Biopsies were taken with a cold forceps for histology.      The duodenal bulb and second portion of the duodenum were normal. Impression:               - LA Grade B reflux esophagitis with no bleeding.                           - Esophagogastric landmarks identified.                           -  4 cm hiatal hernia.                           - Gastritis. Biopsied.                           - Normal duodenal bulb and second portion of the                            duodenum.                           - Biopsies were taken with a cold forceps for                            evaluation of eosinophilic esophagitis. Moderate Sedation:      Per Anesthesia Care Recommendation:           - Patient has a contact number  available for                            emergencies. The signs and symptoms of potential                            delayed complications were discussed with the                            patient. Return to normal activities tomorrow.                            Written discharge instructions were provided to the                            patient.                           - Resume previous diet.                           - Continue present medications.                           - Await pathology results.                           - PPI daily and antireflux measures Procedure Code(s):        --- Professional ---                           [phone], Esophagogastroduodenoscopy, flexible,                            transoral; with biopsy, single or multiple Diagnosis Code(s):        --- Professional ---                           K21.00, Gastro-esophageal reflux disease with  esophagitis, without bleeding                           K44.9, Diaphragmatic hernia without obstruction or                            gangrene                           K29.70, Gastritis, unspecified, without bleeding                           R10.13, Epigastric pain                           R13.10, Dysphagia, unspecified CPT copyright 2022 American Medical Association. All rights reserved. The codes documented in this report are preliminary and upon coder review may  be revised to meet current compliance requirements. Sanjuan Dame, MD Sanjuan Dame, MD 11/09/2022 8:59:10 AM This report has been signed electronically. Number of Addenda: 0

## 2022-11-09 NOTE — Discharge Instructions (Signed)

## 2022-11-09 NOTE — Anesthesia Postprocedure Evaluation (Signed)
Anesthesia Post Note  Patient: Brandy Craig  Procedure(s) Performed: COLONOSCOPY WITH PROPOFOL ESOPHAGOGASTRODUODENOSCOPY (EGD) WITH PROPOFOL BIOPSY SCLEROTHERAPY SUBMUCOSAL LIFTING INJECTION POLYPECTOMY HEMOSTASIS CLIP PLACEMENT ENDOSCOPIC MUCOSAL RESECTION  Patient location during evaluation: Phase II Anesthesia Type: General Level of consciousness: awake Pain management: pain level controlled Vital Signs Assessment: post-procedure vital signs reviewed and stable Respiratory status: spontaneous breathing and respiratory function stable Cardiovascular status: blood pressure returned to baseline and stable Postop Assessment: no headache and no apparent nausea or vomiting Anesthetic complications: no Comments: Late entry   No notable events documented.   Last Vitals:  Vitals:   11/09/22 0711 11/09/22 0856  BP: 123/74 (!) 133/53  Pulse: (!) 55 (!) 58  Resp: 11 20  Temp: 36.6 C (!) 36.4 C  SpO2: 97% 100%    Last Pain:  Vitals:   11/09/22 0902  TempSrc:   PainSc: 0-No pain                 Windell Norfolk

## 2022-11-09 NOTE — H&P (Signed)
Primary Care Physician:  Wendall Papa Primary Gastroenterologist:  Dr. Tasia Catchings  Pre-Procedure History & Physical: HPI:  Brandy Craig is a 62 y.o. female with ?history of recurrent diverticulitis who presents for evaluation of Dysphagia and colonoscopy after episode of diverticulitis   She had last colonoscopy 2021 which was incomplete due to severe diverticulosis tortuous and redundant colon.   Patient reports that for past 1 year she has liquid and solid food dysphagia.  She would feel something slow down in the middle of her chest .  She would have episodes of regurgitation if she eats too full   Has occasional hard stools with straining   Past Medical History:  Diagnosis Date   Diverticulitis    Osteoporosis     Past Surgical History:  Procedure Laterality Date   btl     COLONOSCOPY     Dr. Guadlupe Spanish in [place]. pt states due 2025   TUBAL LIGATION      Prior to Admission medications   Medication Sig Start Date End Date Taking? Authorizing Provider  Cinnamon 500 MG TABS Take 500 mg by mouth daily.   Yes [provider]  conjugated estrogens (PREMARIN) vaginal cream Pea-sized (0.5g) amount in the vagina twice per week 09/29/22   Myna Hidalgo, DO  OVER THE COUNTER MEDICATION Calcium with vit d 1000 daily  Multi vit daily   Vit b12 daily   Fish oil every other day   Gelatin pills about 3 times per week   Lysine 2,000 mg in the morning.    [provider]  polyethylene glycol (MIRALAX / GLYCOLAX) 17 g packet Take 17 g by mouth 2 (two) times daily. 09/28/22 12/27/22  Franky Macho, MD  psyllium (METAMUCIL) 58.6 % packet Take 1 packet by mouth 2 (two) times daily. Patient not taking: Reported on 09/29/2022 09/28/22 12/27/22  Franky Macho, MD    Allergies as of 09/29/2022   (No Known Allergies)    Family History  Problem Relation Age of Onset   Hypertension Mother    Hypertension Paternal Grandfather     Social History   Socioeconomic  History   Marital status: Married    Spouse name: Not on file   Number of children: Not on file   Years of education: Not on file   Highest education level: Not on file  Occupational History   Not on file  Tobacco Use   Smoking status: Former    Passive exposure: Past   Smokeless tobacco: Never  Vaping Use   Vaping status: Never Used  Substance and Sexual Activity   Alcohol use: No   Drug use: No   Sexual activity: Yes    Birth control/protection: Surgical  Other Topics Concern   Not on file  Social History Narrative   Not on file   Social Determinants of Health   Financial Resource Strain: Not on file  Food Insecurity: Not on file  Transportation Needs: Not on file  Physical Activity: Not on file  Stress: Not on file  Social Connections: Not on file  Intimate Partner Violence: Not on file    Review of Systems: See HPI, otherwise negative ROS  Physical Exam: Vital signs in last 24 hours: Temp:  [97.9 F (36.6 C)] 97.9 F (36.6 C) (09/23 0711) Pulse Rate:  [55] 55 (09/23 0711) Resp:  [11] 11 (09/23 0711) BP: (123)/(74) 123/74 (09/23 0711) SpO2:  [97 %] 97 % (09/23 0711) Weight:  [56.2 kg] 56.2 kg (09/23 0651)  General:   Alert,  Well-developed, well-nourished, pleasant and cooperative in NAD Head:  Normocephalic and atraumatic. Eyes:  Sclera clear, no icterus.   Conjunctiva pink. Ears:  Normal auditory acuity. Nose:  No deformity, discharge,  or lesions. Msk:  Symmetrical without gross deformities. Normal posture. Extremities:  Without clubbing or edema. Neurologic:  Alert and  oriented x4;  grossly normal neurologically. Skin:  Intact without significant lesions or rashes. Psych:  Alert and cooperative. Normal mood and affect.  Impression/Plan: Brandy Craig is a 62 y.o. female with ?history of recurrent diverticulitis who presents for evaluation of Dysphagia and colonoscopy after episode of diverticulitis . Proceed with EGD and Colonoscopy   The risks  of the procedure including infection, bleed, or perforation as well as benefits, limitations, alternatives and imponderables have been reviewed with the patient. Questions have been answered. All parties agreeable.

## 2022-11-10 LAB — SURGICAL PATHOLOGY

## 2022-11-11 ENCOUNTER — Telehealth: Payer: Self-pay

## 2022-11-11 ENCOUNTER — Other Ambulatory Visit (HOSPITAL_COMMUNITY): Payer: Self-pay | Admitting: Gastroenterology

## 2022-11-11 DIAGNOSIS — K2270 Barrett's esophagus without dysplasia: Secondary | ICD-10-CM

## 2022-11-11 MED ORDER — PANTOPRAZOLE SODIUM 40 MG PO TBEC
40.0000 mg | DELAYED_RELEASE_TABLET | Freq: Every day | ORAL | 3 refills | Status: DC
Start: 2022-11-11 — End: 2023-05-13

## 2022-11-11 MED ORDER — ESTRADIOL 0.1 MG/GM VA CREA
TOPICAL_CREAM | VAGINAL | 1 refills | Status: AC
Start: 2022-11-11 — End: ?

## 2022-11-11 NOTE — Telephone Encounter (Signed)
Patient called and stated that she came in on 09/29/2022 and saw Dr. Charlotta Newton, she was prescribed Premarin cream.  Patients insurance required a prior auth, pt was wanting to know the status of the prescription.  Could you please give the patient a call.

## 2022-11-11 NOTE — Telephone Encounter (Signed)
Pt aware will send in rx for estrace vaginal cream, PVC not covered

## 2022-11-11 NOTE — Progress Notes (Signed)
I reviewed the pathology results. Ann, can you send her a letter with the findings as described below please?  Repeat colonoscopy in 9 MONTHS Repeat Upper endoscopy in 5 years  Thanks,  Vista Lawman, MD Gastroenterology and Hepatology [facility]  ---------------------------------------------------------------------------------------------  [facility] [address], [place], [place] [address] Phone:  [phone]   11/11/22 Brandy Craig, [place]   Dear [name],  I am writing to inform you that the biopsies taken during your recent endoscopic examination showed:   I am writing to let you know the results of your recent colonoscopy.  You had a total of 1 polyp removed. 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  Given these findings and because your polyp was large taken out in pieces , it is recommended that your next colonoscopy be performed in 9 months.   I also recommend that any first degree relative ( siblings ,children) to start colonoscopy at age 59 because you were found to have a large colon polyp   I am also  writing to let you know the results of your recent upper endoscopy.  The biopsies of your esophagus showed something called "Barrett's esophagus without dysplasia." It means that after years of acid reflux the esophagus cells changed into a kind that is at a slightly increased risk for turning cancer. Because of this finding, we recommend that you take an acid blocking medication (Pantoprazole 40mg  once a day) to protect your esophagus. We also recommend that you have a repeat upper endoscopy in 5 years to check for cancer.   Please call us at [phone] if you have persistent problems or have questions  about your condition that have not been fully answered at this time.  Sincerely,  Vista Lawman, MD Gastroenterology and Hepatology   ============== >39mm piecemeal TA Short Segment Barrett's

## 2022-11-13 ENCOUNTER — Encounter (INDEPENDENT_AMBULATORY_CARE_PROVIDER_SITE_OTHER): Payer: Self-pay | Admitting: *Deleted

## 2022-11-17 ENCOUNTER — Encounter (HOSPITAL_COMMUNITY): Payer: Self-pay | Admitting: Gastroenterology

## 2022-11-30 ENCOUNTER — Other Ambulatory Visit (HOSPITAL_COMMUNITY): Payer: Self-pay | Admitting: Obstetrics & Gynecology

## 2022-11-30 DIAGNOSIS — Z1231 Encounter for screening mammogram for malignant neoplasm of breast: Secondary | ICD-10-CM

## 2022-12-07 ENCOUNTER — Ambulatory Visit (HOSPITAL_COMMUNITY)
Admission: RE | Admit: 2022-12-07 | Discharge: 2022-12-07 | Disposition: A | Discharge status: Home / Self Care | Payer: 59 | Source: Ambulatory Visit | Attending: Obstetrics & Gynecology | Admitting: Obstetrics & Gynecology

## 2022-12-07 ENCOUNTER — Encounter (HOSPITAL_COMMUNITY): Payer: Self-pay

## 2022-12-07 DIAGNOSIS — Z1231 Encounter for screening mammogram for malignant neoplasm of breast: Secondary | ICD-10-CM | POA: Diagnosis present

## 2023-05-13 ENCOUNTER — Other Ambulatory Visit (INDEPENDENT_AMBULATORY_CARE_PROVIDER_SITE_OTHER): Payer: Self-pay | Admitting: Gastroenterology

## 2023-05-13 DIAGNOSIS — K2270 Barrett's esophagus without dysplasia: Secondary | ICD-10-CM

## 2023-05-13 NOTE — Telephone Encounter (Signed)
 Patient started on pantoprazole after EGD on 11/09/22 for barrett's esophagus

## 2023-05-31 ENCOUNTER — Other Ambulatory Visit: Payer: Self-pay

## 2023-05-31 ENCOUNTER — Emergency Department (HOSPITAL_COMMUNITY)
Admission: EM | Admit: 2023-05-31 | Discharge: 2023-05-31 | Disposition: A | Discharge status: Home / Self Care | Attending: Emergency Medicine | Admitting: Emergency Medicine

## 2023-05-31 ENCOUNTER — Encounter (HOSPITAL_COMMUNITY): Payer: Self-pay | Admitting: Emergency Medicine

## 2023-05-31 ENCOUNTER — Emergency Department (HOSPITAL_COMMUNITY)

## 2023-05-31 DIAGNOSIS — R1030 Lower abdominal pain, unspecified: Secondary | ICD-10-CM

## 2023-05-31 DIAGNOSIS — D72829 Elevated white blood cell count, unspecified: Secondary | ICD-10-CM | POA: Insufficient documentation

## 2023-05-31 DIAGNOSIS — R1084 Generalized abdominal pain: Secondary | ICD-10-CM | POA: Insufficient documentation

## 2023-05-31 DIAGNOSIS — R197 Diarrhea, unspecified: Secondary | ICD-10-CM | POA: Diagnosis not present

## 2023-05-31 DIAGNOSIS — K529 Noninfective gastroenteritis and colitis, unspecified: Secondary | ICD-10-CM

## 2023-05-31 DIAGNOSIS — R112 Nausea with vomiting, unspecified: Secondary | ICD-10-CM | POA: Insufficient documentation

## 2023-05-31 LAB — CBC WITH DIFFERENTIAL/PLATELET
Abs Immature Granulocytes: 0.07 10*3/uL (ref 0.00–0.07)
Basophils Absolute: 0.1 10*3/uL (ref 0.0–0.1)
Basophils Relative: 0 %
Eosinophils Absolute: 0.1 10*3/uL (ref 0.0–0.5)
Eosinophils Relative: 0 %
HCT: 46.4 % — ABNORMAL HIGH (ref 36.0–46.0)
Hemoglobin: 16.1 g/dL — ABNORMAL HIGH (ref 12.0–15.0)
Immature Granulocytes: 0 %
Lymphocytes Relative: 2 %
Lymphs Abs: 0.4 10*3/uL — ABNORMAL LOW (ref 0.7–4.0)
MCH: 31.8 pg (ref 26.0–34.0)
MCHC: 34.7 g/dL (ref 30.0–36.0)
MCV: 91.7 fL (ref 80.0–100.0)
Monocytes Absolute: 1.3 10*3/uL — ABNORMAL HIGH (ref 0.1–1.0)
Monocytes Relative: 7 %
Neutro Abs: 16.8 10*3/uL — ABNORMAL HIGH (ref 1.7–7.7)
Neutrophils Relative %: 91 %
Platelets: 335 10*3/uL (ref 150–400)
RBC: 5.06 MIL/uL (ref 3.87–5.11)
RDW: 12.2 % (ref 11.5–15.5)
WBC: 18.7 10*3/uL — ABNORMAL HIGH (ref 4.0–10.5)
nRBC: 0 % (ref 0.0–0.2)

## 2023-05-31 LAB — URINALYSIS, ROUTINE W REFLEX MICROSCOPIC
Bacteria, UA: NONE SEEN
Glucose, UA: NEGATIVE mg/dL
Hgb urine dipstick: NEGATIVE
Ketones, ur: 20 mg/dL — AB
Leukocytes,Ua: NEGATIVE
Nitrite: NEGATIVE
Protein, ur: 30 mg/dL — AB
Specific Gravity, Urine: 1.031 — ABNORMAL HIGH (ref 1.005–1.030)
pH: 5 (ref 5.0–8.0)

## 2023-05-31 LAB — COMPREHENSIVE METABOLIC PANEL WITH GFR
ALT: 37 U/L (ref 0–44)
AST: 29 U/L (ref 15–41)
Albumin: 4 g/dL (ref 3.5–5.0)
Alkaline Phosphatase: 71 U/L (ref 38–126)
Anion gap: 13 (ref 5–15)
BUN: 25 mg/dL — ABNORMAL HIGH (ref 8–23)
CO2: 24 mmol/L (ref 22–32)
Calcium: 9.3 mg/dL (ref 8.9–10.3)
Chloride: 100 mmol/L (ref 98–111)
Creatinine, Ser: 0.74 mg/dL (ref 0.44–1.00)
GFR, Estimated: >60 mL/min (ref >60)
Glucose, Bld: 144 mg/dL — ABNORMAL HIGH (ref 70–99)
Potassium: 3.8 mmol/L (ref 3.5–5.1)
Sodium: 137 mmol/L (ref 135–145)
Total Bilirubin: 0.9 mg/dL (ref 0.0–1.2)
Total Protein: 7.1 g/dL (ref 6.5–8.1)

## 2023-05-31 LAB — LIPASE, BLOOD: Lipase: 33 U/L (ref 11–51)

## 2023-05-31 MED ORDER — ONDANSETRON HCL 4 MG PO TABS: 4.0000 mg | ORAL_TABLET | Freq: Four times a day (QID) | ORAL | 0 refills | Status: AC

## 2023-05-31 MED ORDER — IOHEXOL 300 MG/ML  SOLN
100.0000 mL | Freq: Once | INTRAMUSCULAR | Status: AC | PRN
  Administered 2023-05-31: 100 mL via INTRAVENOUS

## 2023-05-31 MED ORDER — ONDANSETRON HCL 4 MG/2ML IJ SOLN
4.0000 mg | Freq: Once | INTRAMUSCULAR | Status: AC
  Administered 2023-05-31: 4 mg via INTRAVENOUS
  Filled 2023-05-31: qty 2

## 2023-05-31 MED ORDER — HYDROMORPHONE HCL 1 MG/ML IJ SOLN
0.5000 mg | Freq: Once | INTRAMUSCULAR | Status: AC
  Administered 2023-05-31: 0.5 mg via INTRAVENOUS
  Filled 2023-05-31: qty 0.5

## 2023-05-31 MED ORDER — AMOXICILLIN-POT CLAVULANATE 875-125 MG PO TABS: 1.0000 | ORAL_TABLET | Freq: Two times a day (BID) | ORAL | 0 refills | Status: DC

## 2023-05-31 MED ORDER — SODIUM CHLORIDE 0.9 % IV BOLUS
1000.0000 mL | Freq: Once | INTRAVENOUS | Status: AC
  Administered 2023-05-31: 1000 mL via INTRAVENOUS

## 2023-05-31 MED ORDER — IBUPROFEN 800 MG PO TABS: 800.0000 mg | ORAL_TABLET | Freq: Three times a day (TID) | ORAL | 0 refills | Status: DC | PRN

## 2023-05-31 NOTE — ED Provider Notes (Signed)
 6:29 PM Patient signed out to me by previous ED physician. Pt is a 63 yo female presenting for lower abdominal pain.  Ctpending  Physical Exam  BP 127/71   Pulse 72   Temp 98.8 F (37.1 C) (Oral)   Resp 17   Ht 5\' 1"  (1.549 m)   Wt 57.6 kg   LMP 08/18/2013   SpO2 94%   BMI 24.00 kg/m   Physical Exam  Procedures  Procedures  ED Course / MDM    Medical Decision Making Amount and/or Complexity of Data Reviewed Labs: ordered. Radiology: ordered.  Risk Prescription drug management.   CT abdomen pelvis with IV contrast demonstrates no acute process.  Symptoms of nausea, vomiting, generalized abdominal pain, and diarrhea possibly secondary to gastroenteritis.  Patient given prescription for Zofran and recommended for follow-up with PCP if symptoms not improve in the next 5 to 7 days.  IV fluids given prior to discharge.  Able to tolerate p.o.  Patient in no distress and overall condition improved here in the ED. Detailed discussions were had with the patient regarding current findings, and need for close f/u with PCP or on call doctor. The patient has been instructed to return immediately if the symptoms worsen in any way for re-evaluation. Patient verbalized understanding and is in agreement with current care plan. All questions answered prior to discharge.        Quinn Bucco, DO 05/31/23 1829

## 2023-05-31 NOTE — Discharge Instructions (Addendum)
 You were seen for gastroenteritis (nausea, vomiting, abdominal pain, diarrhea) in the emergency department.   Zofran for nausea sent to pharmacy. Start with a clear liquid diet (coffee, tea, sports drinks, broths, etc.) and advance as tolerated to solid foods over the next few days.    Check your MyChart online for the results of any tests that had not resulted by the time you left the emergency department.   Follow-up with your primary doctor in 2-3 days regarding your visit.  Follow-up with the gastroenterologist to discuss if you need a colonoscopy in 6 weeks. If you have had more than 3 occurrences you may need to also talk to the general surgeons to see if you need an operation.   Return immediately to the emergency department if you experience any of the following: severe abdominal pain, high fevers, or any other concerning symptoms.    Thank you for visiting our Emergency Department. It was a pleasure taking care of you today.

## 2023-05-31 NOTE — ED Triage Notes (Signed)
 Lower abd pain and nausea that started 3AM

## 2023-05-31 NOTE — ED Provider Notes (Signed)
 [place] EMERGENCY DEPARTMENT AT [facility] Provider Note   CSN: [id number] Arrival date & time: 05/31/23  1136     History  Chief Complaint  Patient presents with   Abdominal Pain    Brandy Craig is a 63 y.o. female.  63 year old female with a history of diverticulosis and pelvic pain who presents to the emergency department lower abdominal pain.  Patient reports that last night at 3 AM she started experiencing lower abdominal pain.  Describes it as sharp.  10/10 in severity.  Constant.  Also has had some nausea and vomiting that been nonbloody nonbilious.  No diarrhea.  Says that it feels very warm when she pees as well.  No fevers, vaginal discharge, or vaginal bleeding.  No flank pain.  No abdominal surgeries.       Home Medications Prior to Admission medications   Medication Sig Start Date End Date Taking? Authorizing Provider  amoxicillin-clavulanate (AUGMENTIN) 875-125 MG tablet Take 1 tablet by mouth every 12 (twelve) hours. 05/31/23  Yes Ninetta Basket, MD  Cinnamon 500 MG TABS Take 500 mg by mouth daily.    [provider]  estradiol (ESTRACE) 0.1 MG/GM vaginal cream Use 0.5 gm or pea sized amount in vagina 2 x weekly 11/11/22   Javan Messing, NP  OVER THE COUNTER MEDICATION Calcium with vit d 1000 daily  Multi vit daily   Vit b12 daily   Fish oil every other day   Gelatin pills about 3 times per week   Lysine 2,000 mg in the morning.    [provider]  pantoprazole (PROTONIX) 40 MG tablet TAKE 1 TABLET BY MOUTH EVERY DAY 05/13/23   Ahmed, Forbes Ida, MD      Allergies    Patient has no known allergies.    Review of Systems   Review of Systems  Physical Exam Updated Vital Signs BP 128/67   Pulse 70   Temp 97.7 F (36.5 C) (Oral)   Resp 16   Ht 5\' 1"  (1.549 m)   Wt 57.6 kg   LMP 08/18/2013   SpO2 94%   BMI 24.00 kg/m  Physical Exam Vitals and nursing note reviewed.  Constitutional:      General: She is  not in acute distress.    Appearance: She is well-developed.  HENT:     Head: Normocephalic and atraumatic.     Right Ear: External ear normal.     Left Ear: External ear normal.     Nose: Nose normal.  Eyes:     Extraocular Movements: Extraocular movements intact.     Conjunctiva/sclera: Conjunctivae normal.     Pupils: Pupils are equal, round, and reactive to light.  Pulmonary:     Effort: Pulmonary effort is normal. No respiratory distress.  Abdominal:     General: Abdomen is flat. There is no distension.     Palpations: Abdomen is soft. There is no mass.     Tenderness: There is abdominal tenderness (Right lower quadrant, suprapubic, left lower quadrant). There is no guarding.  Musculoskeletal:     Cervical back: Normal range of motion and neck supple.  Skin:    General: Skin is warm and dry.  Neurological:     Mental Status: She is alert and oriented to person, place, and time. Mental status is at baseline.  Psychiatric:        Mood and Affect: Mood normal.     ED Results / Procedures / Treatments  Labs (all labs ordered are listed, but only abnormal results are displayed) Labs Reviewed  COMPREHENSIVE METABOLIC PANEL WITH GFR - Abnormal; Notable for the following components:      Result Value   Glucose, Bld 144 (*)    BUN 25 (*)    All other components within normal limits  CBC WITH DIFFERENTIAL/PLATELET - Abnormal; Notable for the following components:   WBC 18.7 (*)    Hemoglobin 16.1 (*)    HCT 46.4 (*)    Neutro Abs 16.8 (*)    Lymphs Abs 0.4 (*)    Monocytes Absolute 1.3 (*)    All other components within normal limits  URINALYSIS, ROUTINE W REFLEX MICROSCOPIC - Abnormal; Notable for the following components:   Color, Urine AMBER (*)    Specific Gravity, Urine 1.031 (*)    Bilirubin Urine SMALL (*)    Ketones, ur 20 (*)    Protein, ur 30 (*)    All other components within normal limits  LIPASE, BLOOD    EKG None  Radiology No results  found.  Procedures Procedures    Medications Ordered in ED Medications  HYDROmorphone (DILAUDID) injection 0.5 mg (has no administration in time range)  HYDROmorphone (DILAUDID) injection 0.5 mg (0.5 mg Intravenous Given 05/31/23 1240)  ondansetron (ZOFRAN) injection 4 mg (4 mg Intravenous Given 05/31/23 1240)  iohexol (OMNIPAQUE) 300 MG/ML solution 100 mL (100 mLs Intravenous Contrast Given 05/31/23 1417)    ED Course/ Medical Decision Making/ A&P                                 Medical Decision Making Amount and/or Complexity of Data Reviewed Labs: ordered. Radiology: ordered.  Risk Prescription drug management.   Brandy Craig is a 63 y.o. female with comorbidities that complicate the patient evaluation including diverticulosis and pelvic pain who presents to the emergency department with lower abdominal pain  Initial Ddx:  Diverticulitis, colitis, UTI, testicular/ovarian torsion, kidney stone, pyelonephritis  MDM:  Based on the patient's symptoms feel that they likely have diverticulitis.  Will obtain CT scan which will show if they have colitis which could also be causing similar symptoms.  This will also show they have a kidney stone.  With the lower abdominal pain we will send off urinalysis to test for UTI and pyelonephritis.    Plan:  Labs Urinalysis CT abdomen pelvis IV contrast Pain and nausea medication  ED Summary/Re-evaluation:  Patient's labs returned and showed elevated white blood cell count of 18.7.  Urinalysis without evidence of UTI.  Chemistry unremarkable.  She is awaiting a CTA which on my read appears to show some inflammation that appears to be consistent with diverticulitis or sigmoid colitis.  Signed out to the oncoming physician (Dr. Wallace Cullens) awaiting her CT read.  This patient presents to the ED for concern of complaints listed in HPI, this involves an extensive number of treatment options, and is a complaint that carries with it a high risk of  complications and morbidity. Disposition including potential need for admission considered.   Dispo: Pending remainder of workup  Records reviewed Outpatient Clinic Notes The following labs were independently interpreted: Chemistry and show no acute abnormality I have reviewed the patients home medications and made adjustments as needed   Final Clinical Impression(s) / ED Diagnoses Final diagnoses:  Lower abdominal pain    Rx / DC Orders ED Discharge Orders  Ordered    amoxicillin-clavulanate (AUGMENTIN) 875-125 MG tablet  Every 12 hours        05/31/23 1552              Ninetta Basket, MD 05/31/23 [phone]

## 2023-06-24 ENCOUNTER — Encounter (INDEPENDENT_AMBULATORY_CARE_PROVIDER_SITE_OTHER): Payer: Self-pay | Admitting: *Deleted

## 2023-07-06 ENCOUNTER — Telehealth (INDEPENDENT_AMBULATORY_CARE_PROVIDER_SITE_OTHER): Payer: Self-pay | Admitting: Gastroenterology

## 2023-07-06 NOTE — Telephone Encounter (Signed)
Ok to schedule.  Room : any   Thanks,  Vista Lawman, MD Gastroenterology and Hepatology [facility]

## 2023-07-06 NOTE — Telephone Encounter (Signed)
 Who is your primary care physician: Dr.Keavie Hairfield  Reasons for the colonoscopy:   Have you had a colonoscopy before?    Do you have family history of colon cancer?   Previous colonoscopy with polyps removed? Yes Sept 2024  Do you have a history colorectal cancer?     Are you diabetic? If yes, Type 1 or Type 2?    no  Do you have a prosthetic or mechanical heart valve? no  Do you have a pacemaker/defibrillator?   no  Have you had endocarditis/atrial fibrillation? no  Have you had joint replacement within the last 12 months?  no  Do you tend to be constipated or have to use laxatives? yes  Do you have any history of drugs or alchohol?  no  Do you use supplemental oxygen?  no  Have you had a stroke or heart attack within the last 6 months? no  Do you take weight loss medication?  no  For female patients: have you had a hysterectomy?  no                                     are you post menopausal?       no                                            do you still have your menstrual cycle? no      Do you take any blood-thinning medications such as: (aspirin, warfarin, Plavix, Aggrenox)  no  If yes we need the name, milligram, dosage and who is prescribing doctor  Current Outpatient Medications on File Prior to Visit  Medication Sig Dispense Refill   Cinnamon 500 MG TABS Take 500 mg by mouth daily.     estradiol  (ESTRACE ) 0.1 MG/GM vaginal cream Use 0.5 gm or pea sized amount in vagina 2 x weekly 42.5 g 1   ibuprofen  (ADVIL ) 800 MG tablet Take 1 tablet (800 mg total) by mouth every 8 (eight) hours as needed for moderate pain (pain score 4-6). 21 tablet 0   ondansetron  (ZOFRAN ) 4 MG tablet Take 1 tablet (4 mg total) by mouth every 6 (six) hours. 12 tablet 0   OVER THE COUNTER MEDICATION Calcium with vit d 1000 daily  Multi vit daily   Vit b12 daily   Fish oil every other day   Gelatin pills about 3 times per week   Lysine 2,000 mg in the morning.     pantoprazole   (PROTONIX ) 40 MG tablet TAKE 1 TABLET BY MOUTH EVERY DAY 90 tablet 2   No current facility-administered medications on file prior to visit.    No Known Allergies   Pharmacy: [facility]  Primary Insurance Name: [facility]  Best number where you can be reached:

## 2023-07-07 NOTE — Telephone Encounter (Signed)
 Left message to return call

## 2023-07-09 NOTE — Telephone Encounter (Signed)
 Left message to return call

## 2023-07-15 NOTE — Telephone Encounter (Signed)
 Letter mailed to pt.

## 2023-07-21 NOTE — Telephone Encounter (Signed)
 LMOVM to return call  Pt left vm wanting to scheduled colonoscopy

## 2023-07-22 ENCOUNTER — Encounter (INDEPENDENT_AMBULATORY_CARE_PROVIDER_SITE_OTHER): Payer: Self-pay | Admitting: *Deleted

## 2023-07-22 ENCOUNTER — Other Ambulatory Visit (INDEPENDENT_AMBULATORY_CARE_PROVIDER_SITE_OTHER): Payer: Self-pay | Admitting: *Deleted

## 2023-07-22 MED ORDER — PEG 3350-KCL-NA BICARB-NACL 420 G PO SOLR: 4000.0000 mL | Freq: Once | ORAL | 0 refills | Status: AC

## 2023-07-22 NOTE — Telephone Encounter (Signed)
 Pt has been scheduled for 08/24/23. Instructions mailed and prep sent to the pharmacy

## 2023-07-22 NOTE — Telephone Encounter (Signed)
 [employer] PA: CPT Code GECOL Description: Colonoscopy Case Number: [id number] Review Date: 07/22/2023 12:18:25 PM Expiration Date: N/A Status: This member is not in scope for prior-authorization/notification for the services requested. You can save the case reference ID as validation of your request.

## 2023-08-23 ENCOUNTER — Telehealth (INDEPENDENT_AMBULATORY_CARE_PROVIDER_SITE_OTHER): Payer: Self-pay | Admitting: Gastroenterology

## 2023-08-23 NOTE — Telephone Encounter (Signed)
 Pt left voicemail wanting to know what time she is to be at the hospital tomorrow. Pt procedure is scheduled for 1pm-pt to be at [facility] at 11:30am. Left voicemail letting pt know.

## 2023-08-24 ENCOUNTER — Ambulatory Visit (HOSPITAL_COMMUNITY): Admitting: Anesthesiology

## 2023-08-24 ENCOUNTER — Other Ambulatory Visit: Payer: Self-pay

## 2023-08-24 ENCOUNTER — Ambulatory Visit (HOSPITAL_COMMUNITY)
Admission: RE | Admit: 2023-08-24 | Discharge: 2023-08-24 | Disposition: A | Discharge status: Home / Self Care | Attending: Gastroenterology | Admitting: Gastroenterology

## 2023-08-24 ENCOUNTER — Encounter (HOSPITAL_COMMUNITY): Payer: Self-pay | Admitting: Gastroenterology

## 2023-08-24 ENCOUNTER — Encounter (HOSPITAL_COMMUNITY)
Admission: RE | Disposition: A | Discharge status: Home / Self Care | Payer: Self-pay | Source: Home / Self Care | Attending: Gastroenterology

## 2023-08-24 DIAGNOSIS — Z8249 Family history of ischemic heart disease and other diseases of the circulatory system: Secondary | ICD-10-CM | POA: Diagnosis not present

## 2023-08-24 DIAGNOSIS — K649 Unspecified hemorrhoids: Secondary | ICD-10-CM

## 2023-08-24 DIAGNOSIS — Z1211 Encounter for screening for malignant neoplasm of colon: Secondary | ICD-10-CM | POA: Diagnosis not present

## 2023-08-24 DIAGNOSIS — Z860101 Personal history of adenomatous and serrated colon polyps: Secondary | ICD-10-CM | POA: Diagnosis present

## 2023-08-24 DIAGNOSIS — Z87891 Personal history of nicotine dependence: Secondary | ICD-10-CM | POA: Insufficient documentation

## 2023-08-24 DIAGNOSIS — Z09 Encounter for follow-up examination after completed treatment for conditions other than malignant neoplasm: Secondary | ICD-10-CM | POA: Insufficient documentation

## 2023-08-24 DIAGNOSIS — Z8719 Personal history of other diseases of the digestive system: Secondary | ICD-10-CM | POA: Insufficient documentation

## 2023-08-24 DIAGNOSIS — I10 Essential (primary) hypertension: Secondary | ICD-10-CM | POA: Diagnosis not present

## 2023-08-24 DIAGNOSIS — K5730 Diverticulosis of large intestine without perforation or abscess without bleeding: Secondary | ICD-10-CM

## 2023-08-24 DIAGNOSIS — K648 Other hemorrhoids: Secondary | ICD-10-CM | POA: Diagnosis not present

## 2023-08-24 HISTORY — PX: COLONOSCOPY: SHX5424

## 2023-08-24 LAB — HM COLONOSCOPY

## 2023-08-24 SURGERY — COLONOSCOPY
Anesthesia: General

## 2023-08-24 MED ORDER — LACTATED RINGERS IV SOLN
INTRAVENOUS | Status: DC
  Administered 2023-08-24: 500 mL via INTRAVENOUS

## 2023-08-24 MED ORDER — LIDOCAINE 2% (20 MG/ML) 5 ML SYRINGE
INTRAMUSCULAR | Status: DC | PRN
Start: 2023-08-24 — End: 2023-08-24
  Administered 2023-08-24: 60 mg via INTRAVENOUS

## 2023-08-24 MED ORDER — PROPOFOL 10 MG/ML IV BOLUS
INTRAVENOUS | Status: DC | PRN
  Administered 2023-08-24: 125 ug/kg/min via INTRAVENOUS
  Administered 2023-08-24: 70 mg via INTRAVENOUS

## 2023-08-24 NOTE — Anesthesia Procedure Notes (Signed)
 Date/Time: 08/24/2023 12:38 PM  Performed by: Para Jerelene CROME, CRNAOxygen Delivery Method: Nasal cannula

## 2023-08-24 NOTE — Discharge Instructions (Signed)

## 2023-08-24 NOTE — Transfer of Care (Signed)
 Immediate Anesthesia Transfer of Care Note  Patient: Brandy Craig  Procedure(s) Performed: COLONOSCOPY  Patient Location: Endoscopy Unit  Anesthesia Type:General  Level of Consciousness: drowsy and patient cooperative  Airway & Oxygen Therapy: Patient Spontanous Breathing  Post-op Assessment: Report given to RN and Post -op Vital signs reviewed and stable  Post vital signs: Reviewed and stable  Last Vitals:  Vitals Value Taken Time  BP 119/63 08/24/23 13:27  Temp 36.4 C 08/24/23 13:27  Pulse 65 08/24/23 13:27  Resp 20 08/24/23 13:27  SpO2 98 % 08/24/23 13:27    Last Pain:  Vitals:   08/24/23 1327  TempSrc: Oral  PainSc: 0-No pain      Patients Stated Pain Goal: 2 (08/24/23 1221)  Complications: No notable events documented.

## 2023-08-24 NOTE — H&P (Signed)
 Primary Care Physician:  Lori Thedora BROCKS Primary Gastroenterologist:  Dr. Cinderella  Pre-Procedure History & Physical: HPI:  Brandy Craig is a 63 y.o. female is here for a colonoscopy for surveillance colonoscopy; large TA polyp removed 10 months ago peicemeal resection . Patient also history of short segment Barretts  10/2022 - Tortuous colon. - One 35 mm polyp in the ascending colon, removed with mucosal resection. Resected and retrieved. Clip ( MR conditional) was placed. Clip manufacturer: [employer]. Tattooed. - Diverticulosis in the entire examined colon. - Non- bleeding external and internal hemorrhoids. - Mucosal resection was performed. Resection and retrieval were complete.   No melena or hematochezia.  No abdominal pain or unintentional weight loss.  No change in bowel habits.  Overall feels well from a GI standpoint.  Past Medical History:  Diagnosis Date   Diverticulitis    Osteoporosis     Past Surgical History:  Procedure Laterality Date   BIOPSY  11/09/2022   Procedure: BIOPSY;  Surgeon: Cinderella Deatrice FALCON, MD;  [facility]: [facility];  Service: Endoscopy;;   btl     COLONOSCOPY     Dr. Baby in [place]. pt states due 2025   COLONOSCOPY WITH PROPOFOL  N/A 11/09/2022   Procedure: COLONOSCOPY WITH PROPOFOL ;  Surgeon: Cinderella Deatrice FALCON, MD;  Location: [facility];  Service: Endoscopy;  Laterality: N/A;  7:30am;asa 1-2   ENDOSCOPIC MUCOSAL RESECTION  11/09/2022   Procedure: ENDOSCOPIC MUCOSAL RESECTION;  Surgeon: Cinderella Deatrice FALCON, MD;  Location: [facility];  Service: Endoscopy;;   ESOPHAGOGASTRODUODENOSCOPY (EGD) WITH PROPOFOL  N/A 11/09/2022   Procedure: ESOPHAGOGASTRODUODENOSCOPY (EGD) WITH PROPOFOL ;  Surgeon: Cinderella Deatrice FALCON, MD;  Location: [facility];  Service: Endoscopy;  Laterality: N/A;  7:30am;asa 1-2   HEMOSTASIS CLIP PLACEMENT  11/09/2022   Procedure: HEMOSTASIS CLIP PLACEMENT;  Surgeon: Cinderella Deatrice FALCON, MD;  Location: [facility];  Service:  Endoscopy;;   POLYPECTOMY  11/09/2022   Procedure: POLYPECTOMY;  Surgeon: Cinderella Deatrice FALCON, MD;  [facility]: [facility];  Service: Endoscopy;;   SCLEROTHERAPY  11/09/2022   Procedure: MATIAS;  Surgeon: Cinderella Deatrice FALCON, MD;  [facility]: [facility];  Service: Endoscopy;;   SUBMUCOSAL LIFTING INJECTION  11/09/2022   Procedure: SUBMUCOSAL LIFTING INJECTION;  Surgeon: Cinderella Deatrice FALCON, MD;  Location: [facility];  Service: Endoscopy;;   TUBAL LIGATION      Prior to Admission medications   Medication Sig Start Date End Date Taking? Authorizing Provider  Cinnamon 500 MG TABS Take 500 mg by mouth daily.    [provider]  estradiol  (ESTRACE ) 0.1 MG/GM vaginal cream Use 0.5 gm or pea sized amount in vagina 2 x weekly 11/11/22   Signa Nest A, NP  ibuprofen  (ADVIL ) 800 MG tablet Take 1 tablet (800 mg total) by mouth every 8 (eight) hours as needed for moderate pain (pain score 4-6). 05/31/23   Elnor Bernarda SQUIBB, DO  ondansetron  (ZOFRAN ) 4 MG tablet Take 1 tablet (4 mg total) by mouth every 6 (six) hours. 05/31/23   Elnor Bernarda P, DO  OVER THE COUNTER MEDICATION Calcium with vit d 1000 daily  Multi vit daily   Vit b12 daily   Fish oil every other day   Gelatin pills about 3 times per week   Lysine 2,000 mg in the morning.    [provider]  pantoprazole  (PROTONIX ) 40 MG tablet TAKE 1 TABLET BY MOUTH EVERY DAY 05/13/23   Tawanda Schall, Deatrice FALCON, MD    Allergies as  of 07/22/2023   (No Known Allergies)    Family History  Problem Relation Age of Onset   Hypertension Mother    Hypertension Paternal Grandfather     Social History   Socioeconomic History   Marital status: Married    Spouse name: Not on file   Number of children: Not on file   Years of education: Not on file   Highest education level: Not on file  Occupational History   Not on file  Tobacco Use   Smoking status: Former    Passive exposure: Past   Smokeless tobacco: Never  Vaping Use    Vaping status: Never Used  Substance and Sexual Activity   Alcohol use: No   Drug use: No   Sexual activity: Yes    Birth control/protection: Surgical  Other Topics Concern   Not on file  Social History Narrative   Not on file   Social Drivers of Health   Financial Resource Strain: Not on file  Food Insecurity: Not on file  Transportation Needs: Not on file  Physical Activity: Not on file  Stress: Not on file  Social Connections: Not on file  Intimate Partner Violence: Not on file    Review of Systems: See HPI, otherwise negative ROS  Physical Exam: Vital signs in last 24 hours: BP: ()/()  Arterial Line BP: ()/()    General:   Alert,  Well-developed, well-nourished, pleasant and cooperative in NAD Head:  Normocephalic and atraumatic. Eyes:  Sclera clear, no icterus.   Conjunctiva pink. Ears:  Normal auditory acuity. Nose:  No deformity, discharge,  or lesions. Msk:  Symmetrical without gross deformities. Normal posture. Extremities:  Without clubbing or edema. Neurologic:  Alert and  oriented x4;  grossly normal neurologically. Skin:  Intact without significant lesions or rashes. Psych:  Alert and cooperative. Normal mood and affect.  Impression/Plan:  Brandy Craig is a 63 y.o. female is here for a colonoscopy for surveillance colonoscopy; large TA polyp removed 10 months ago peicemeal resection .  The risks of the procedure including infection, bleed, or perforation as well as benefits, limitations, alternatives and imponderables have been reviewed with the patient. Questions have been answered. All parties agreeable.

## 2023-08-24 NOTE — Anesthesia Preprocedure Evaluation (Signed)

## 2023-08-24 NOTE — Op Note (Signed)
 [facility] Patient Name: Brandy Craig Procedure Date: 08/24/2023 11:31 AM MRN: [id number] Date of Birth: 1960-11-22 Attending MD: Deatrice Dine , MD, [id number] CSN: [id number] Age: 63 Admit Type: Outpatient Procedure:                Colonoscopy Indications:              Surveillance: Personal history of piecemeal removal                            of adenoma on last colonoscopy (less than 1 year                            ago), High risk colon cancer surveillance: Personal                            history of adenoma (10 mm or greater in size) Providers:                Deatrice Dine, MD, Ashley Goins, Tammy Vaught, RN,                            Kristine L. Shirlean Balm, Technician Referring MD:              Medicines:                Monitored Anesthesia Care Complications:             Estimated Blood Loss:      Procedure:                Pre-Anesthesia Assessment:                           - Prior to the procedure, a History and Physical                            was performed, and patient medications and                            allergies were reviewed. The patient's tolerance of                            previous anesthesia was also reviewed. The risks                            and benefits of the procedure and the sedation                            options and risks were discussed with the patient.                            All questions were answered, and informed consent                            was obtained. Prior Anticoagulants: The patient has  taken no anticoagulant or antiplatelet agents                            except for NSAID medication. ASA Grade Assessment:                            II - A patient with mild systemic disease. After                            reviewing the risks and benefits, the patient was                            deemed in satisfactory condition to undergo the                            procedure.                            After obtaining informed consent, the colonoscope                            was passed under direct vision. Throughout the                            procedure, the patient's blood pressure, pulse, and                            oxygen saturations were monitored continuously. The                            [id number]-HQ190L ([id number]) scope was introduced through                            the anus and advanced to the the cecum, identified                            by appendiceal orifice and ileocecal valve. The                            patient tolerated the procedure well. The quality                            of the bowel preparation was evaluated using the                            BBPS [facility] Bowel Preparation Scale) with scores                            of: Right Colon = 3, Transverse Colon = 3 and Left                            Colon = 3 (entire mucosa seen well with no residual  staining, small fragments of stool or opaque                            liquid). The total BBPS score equals 9. The                            ileocecal valve, appendiceal orifice, and rectum                            were photographed. The colonoscopy was technically                            difficult and complex due to restricted mobility of                            the colon, a redundant colon and a tortuous colon.                            Successful completion of the procedure was aided by                            using abdominal binder, manual pressure and                            withdrawing the scope and replacing with the                            neonatal endoscope ( Ultraslim colon). The patient                            tolerated the procedure well. Scope In: 12:42:13 PM Scope Out: 1:23:33 PM Scope Withdrawal Time: 0 hours 15 minutes 17 seconds  Total Procedure Duration: 0 hours 41 minutes 20 seconds  Findings:      The perianal and  digital rectal examinations were normal.      A tattoo was seen in the ascending colon. A post-polypectomy scar was       found at the tattoo site. There was no evidence of residual polyp tissue.      Scattered large-mouthed diverticula were found in the entire colon.      The left colon was significantly tortuous. Advancing the scope required       withdrawing the scope and replacing with the neonatal endoscope.       (Ultraslim colon )      Non-bleeding internal hemorrhoids were found during retroflexion. The       hemorrhoids were small. Impression:               - A tattoo was seen in the ascending colon. A                            post-polypectomy scar was found at the tattoo site.                            There was no evidence of residual polyp tissue.                           -  Diverticulosis in the entire examined colon.                           - Tortuous colon.                           - Non-bleeding internal hemorrhoids.                           - No specimens collected. Moderate Sedation:      Per Anesthesia Care Recommendation:           - Patient has a contact number available for                            emergencies. The signs and symptoms of potential                            delayed complications were discussed with the                            patient. Return to normal activities tomorrow.                            Written discharge instructions were provided to the                            patient.                           - Resume previous diet.                           - Continue present medications.                           - Repeat colonoscopy in 3 years for surveillance                            after piecemeal polypectomy.                           - Return to primary care physician as previously                            scheduled. Procedure Code(s):        --- Professional ---                           [phone], Colonoscopy, flexible;  diagnostic, including                            collection of specimen(s) by brushing or washing,                            when performed (separate procedure) Diagnosis Code(s):        --- Professional ---  Z86.010, Personal history of colonic polyps                           K64.8, Other hemorrhoids                           Z09, Encounter for follow-up examination after                            completed treatment for conditions other than                            malignant neoplasm                           K57.30, Diverticulosis of large intestine without                            perforation or abscess without bleeding                           Q43.8, Other specified congenital malformations of                            intestine CPT copyright 2022 American [employer] Association. All rights reserved. The codes documented in this report are preliminary and upon coder review may  be revised to meet current compliance requirements. Deatrice Dine, MD Deatrice Dine, MD 08/24/2023 1:34:42 PM This report has been signed electronically. Number of Addenda: 0

## 2023-08-25 ENCOUNTER — Encounter (INDEPENDENT_AMBULATORY_CARE_PROVIDER_SITE_OTHER): Payer: Self-pay | Admitting: *Deleted

## 2023-08-25 ENCOUNTER — Encounter (HOSPITAL_COMMUNITY): Payer: Self-pay | Admitting: Gastroenterology

## 2023-08-25 NOTE — Anesthesia Postprocedure Evaluation (Signed)
 Anesthesia Post Note  Patient: Brandy Craig  Procedure(s) Performed: COLONOSCOPY  Patient location during evaluation: Phase II Anesthesia Type: General Level of consciousness: awake Pain management: pain level controlled Vital Signs Assessment: post-procedure vital signs reviewed and stable Respiratory status: spontaneous breathing and respiratory function stable Cardiovascular status: blood pressure returned to baseline and stable Postop Assessment: no headache and no apparent nausea or vomiting Anesthetic complications: no Comments: Late entry   No notable events documented.   Last Vitals:  Vitals:   08/24/23 1327 08/24/23 1333  BP: 119/63 110/65  Pulse: 65 61  Resp: 20 (!) 21  Temp: (!) 36.4 C   SpO2: 98% 99%    Last Pain:  Vitals:   08/24/23 1333  TempSrc:   PainSc: 0-No pain                 Yvonna JINNY Bosworth

## 2023-11-15 ENCOUNTER — Telehealth: Payer: Self-pay

## 2023-11-15 NOTE — Telephone Encounter (Signed)
 Attempted to return pt's call. Left VM for her to call us  back.

## 2023-12-16 ENCOUNTER — Other Ambulatory Visit (HOSPITAL_COMMUNITY): Payer: Self-pay | Admitting: Obstetrics & Gynecology

## 2023-12-16 DIAGNOSIS — Z1231 Encounter for screening mammogram for malignant neoplasm of breast: Secondary | ICD-10-CM

## 2023-12-23 ENCOUNTER — Encounter (INDEPENDENT_AMBULATORY_CARE_PROVIDER_SITE_OTHER): Payer: Self-pay | Admitting: Gastroenterology

## 2023-12-27 ENCOUNTER — Ambulatory Visit (HOSPITAL_COMMUNITY)
Admission: RE | Admit: 2023-12-27 | Discharge: 2023-12-27 | Disposition: A | Discharge status: Home / Self Care | Source: Ambulatory Visit | Attending: Obstetrics & Gynecology | Admitting: Obstetrics & Gynecology

## 2023-12-27 ENCOUNTER — Encounter (HOSPITAL_COMMUNITY): Payer: Self-pay

## 2023-12-27 DIAGNOSIS — Z1231 Encounter for screening mammogram for malignant neoplasm of breast: Secondary | ICD-10-CM | POA: Diagnosis present

## 2023-12-29 ENCOUNTER — Ambulatory Visit (HOSPITAL_COMMUNITY): Payer: Self-pay | Admitting: Obstetrics & Gynecology

## 2024-02-22 DIAGNOSIS — K2270 Barrett's esophagus without dysplasia: Secondary | ICD-10-CM

## 2024-03-01 IMAGING — MG MM DIGITAL SCREENING BILAT W/ TOMO AND CAD
6 of 12 series · 6 of 36 positions shown · non-contrast
Comparison: Previous exam(s).

CLINICAL DATA: Screening.

EXAM:
DIGITAL SCREENING BILATERAL MAMMOGRAM WITH TOMOSYNTHESIS AND CAD
TECHNIQUE: Bilateral screening digital craniocaudal and mediolateral oblique
mammograms were obtained. Bilateral screening digital breast
tomosynthesis was performed. The images were evaluated with
computer-aided detection.

[L XCCL synth-2D]
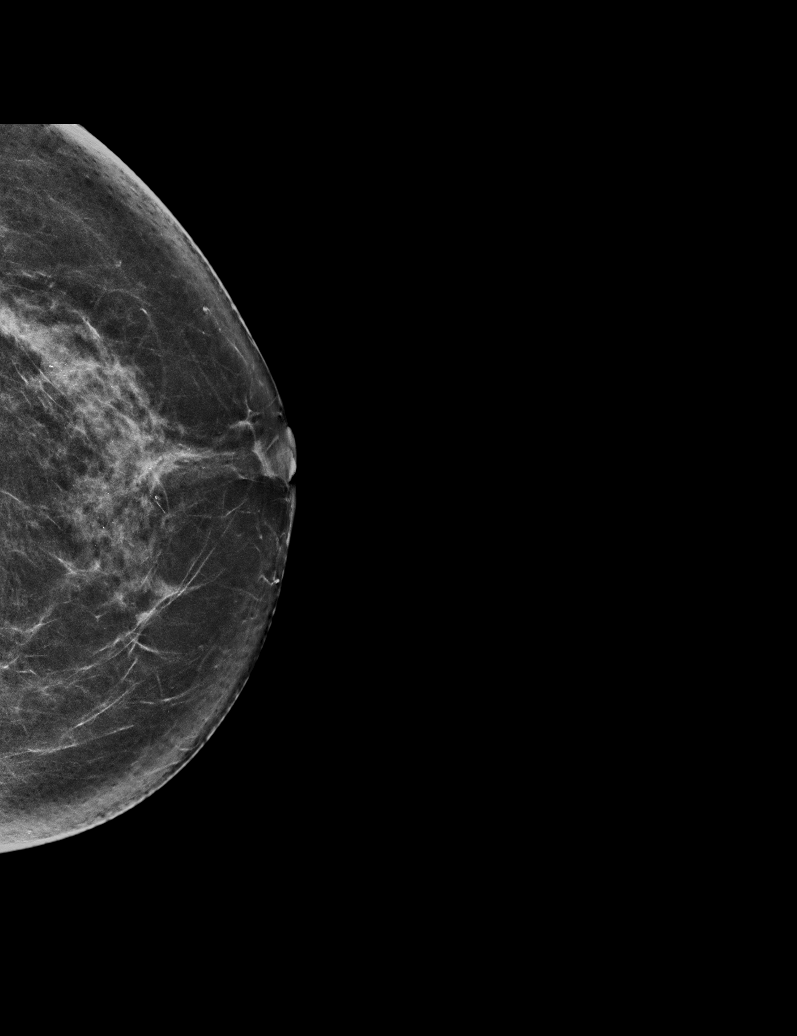

[L MLO synth-2D (1 of 2)]
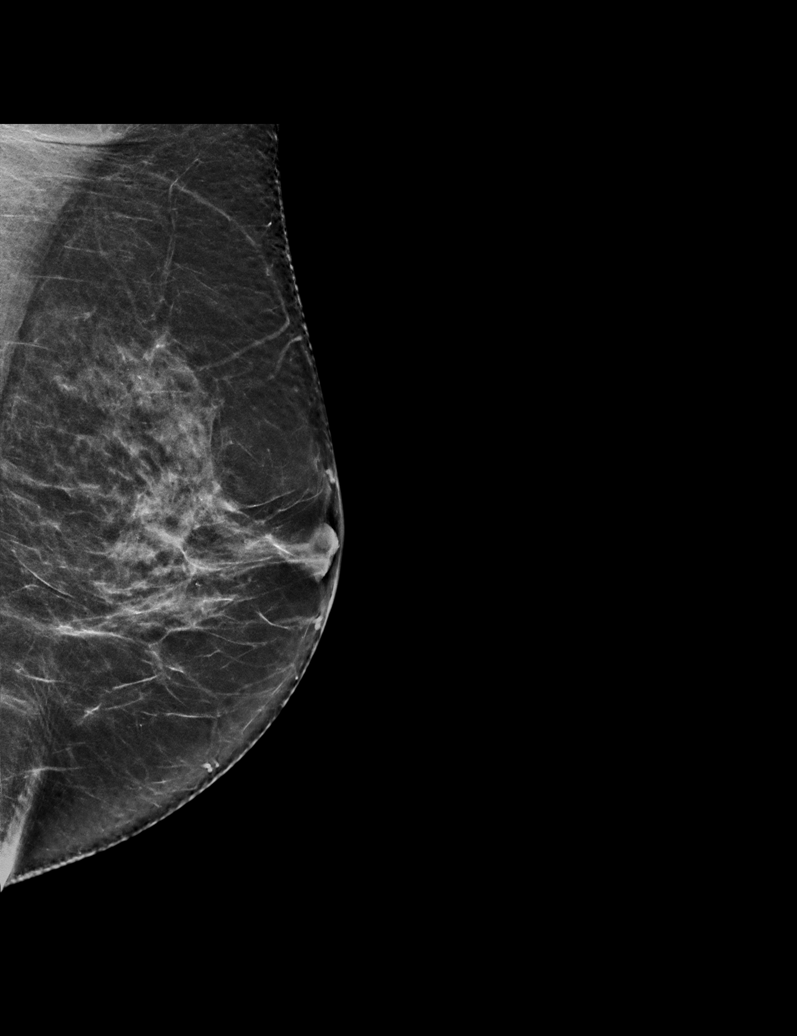

[L CC synth-2D]
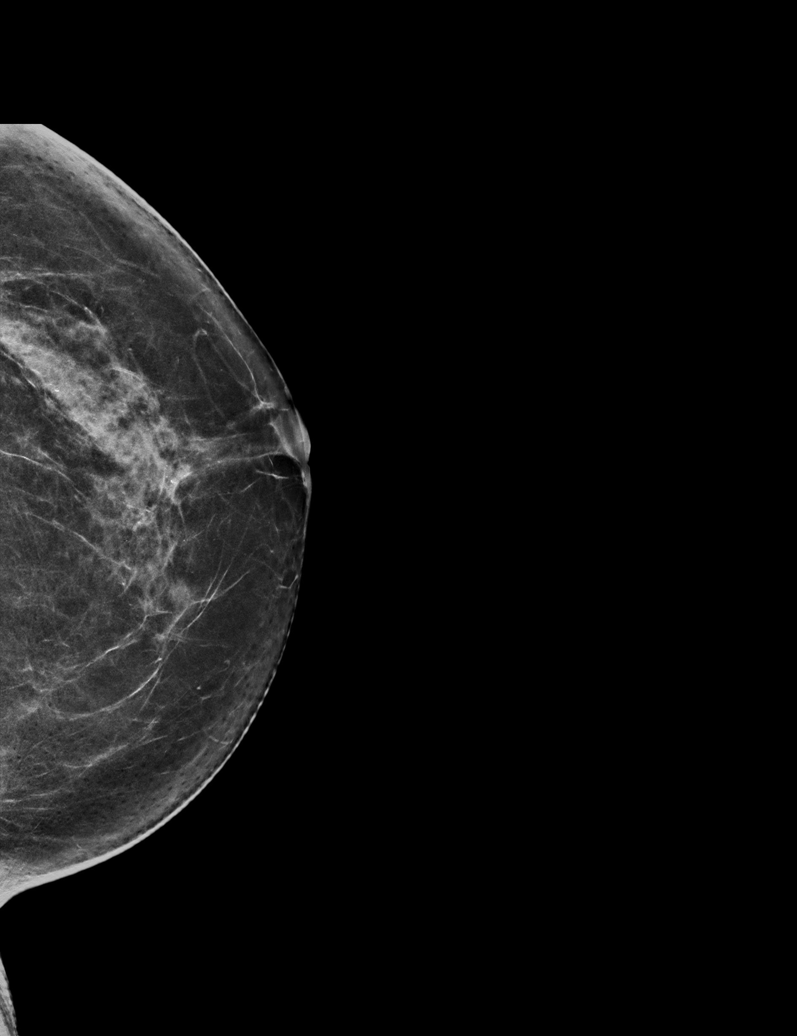

[L MLO synth-2D (2 of 2)]
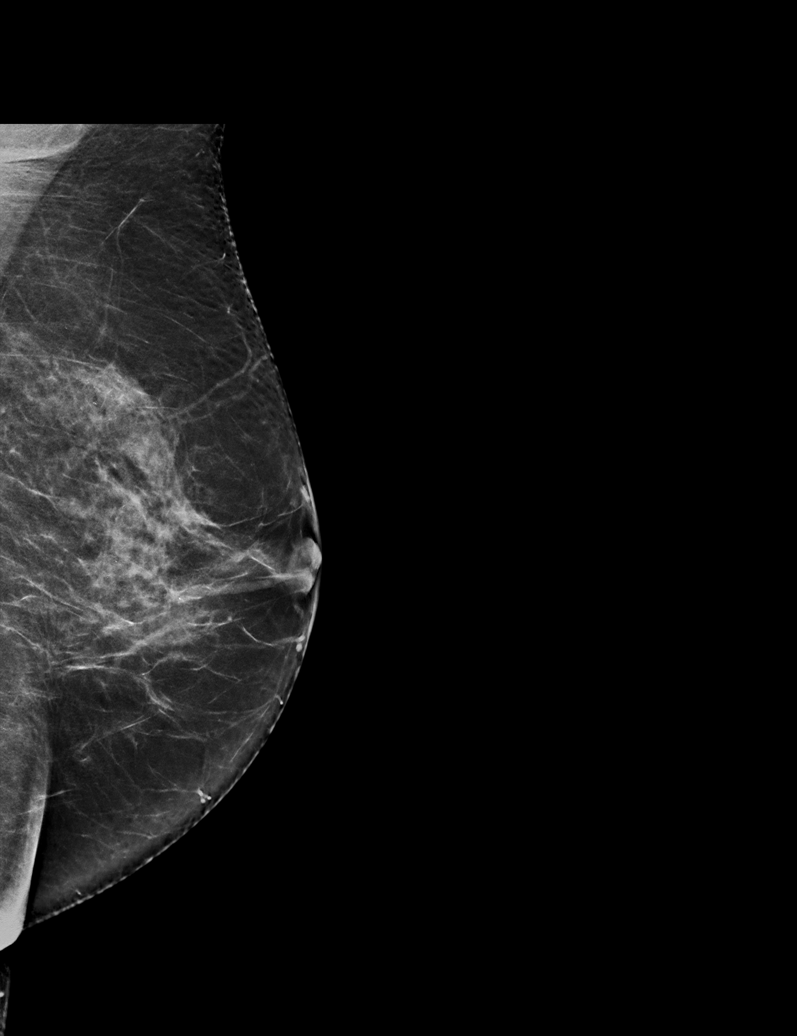

[R CC synth-2D]
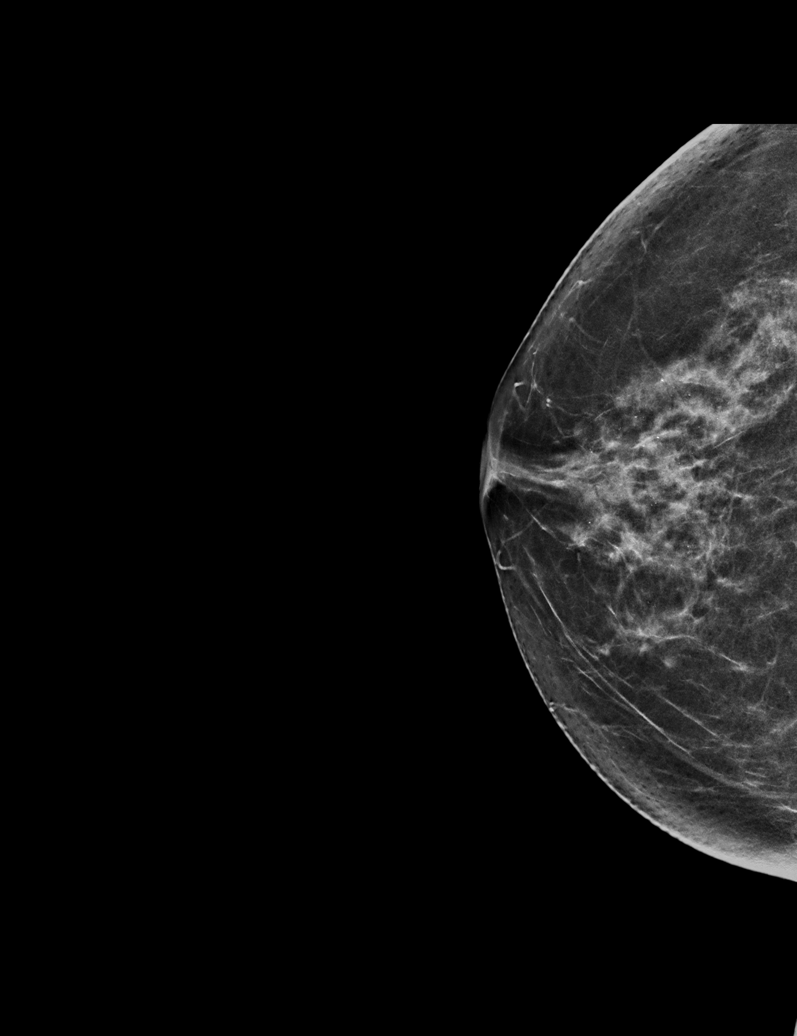

[R MLO synth-2D]
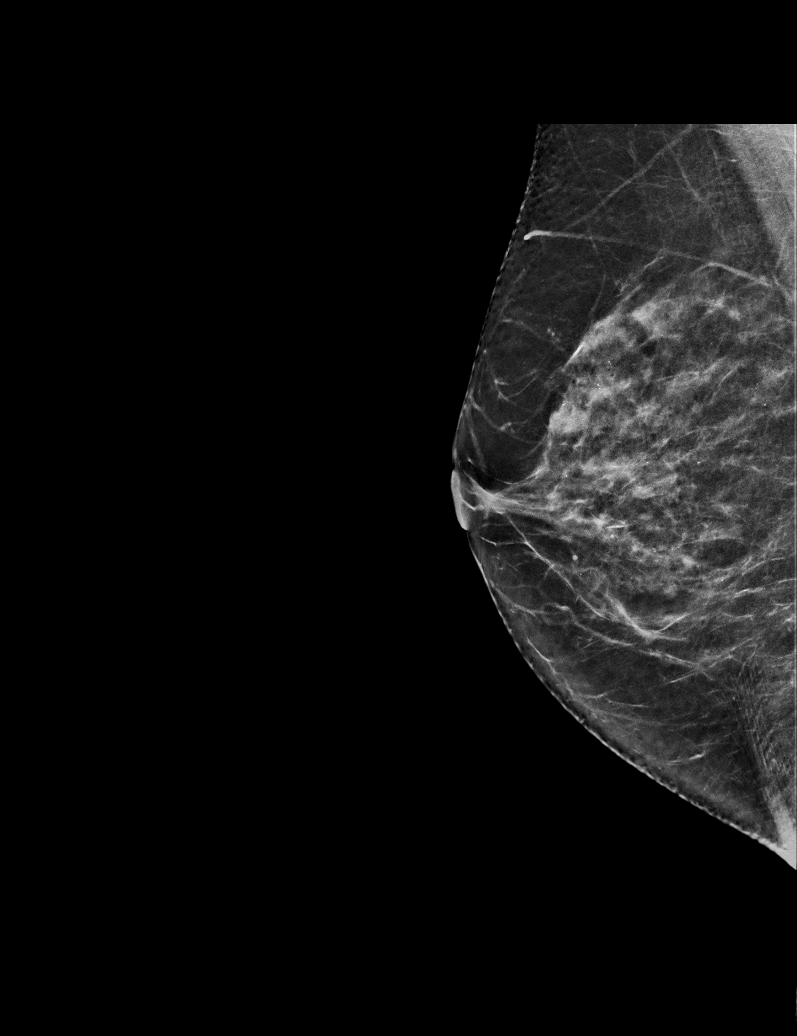

[6 of 36 positions shown; findings below may reference images not displayed]

ACR Breast Density Category c: The breast tissue is heterogeneously
dense, which may obscure small masses.
FINDINGS: There are no findings suspicious for malignancy.
IMPRESSION: No mammographic evidence of malignancy. A result letter of this
screening mammogram will be mailed directly to the patient.

RECOMMENDATION:
Screening mammogram in one year. (Code:Q3-W-BC3)

BI-RADS CATEGORY  1: Negative.

## 2024-03-20 ENCOUNTER — Other Ambulatory Visit (INDEPENDENT_AMBULATORY_CARE_PROVIDER_SITE_OTHER): Payer: Self-pay | Admitting: Gastroenterology

## 2024-03-20 DIAGNOSIS — K2270 Barrett's esophagus without dysplasia: Secondary | ICD-10-CM
